# Patient Record
Sex: Female | Born: 1960 | Race: White | Hispanic: No | Marital: Single | Smoking: Never smoker
Health system: Southern US, Community
[De-identification: ages and names within clinical notes are randomized; demographics above are authoritative.]

## PROBLEM LIST (undated history)

## (undated) DIAGNOSIS — E785 Hyperlipidemia, unspecified: Secondary | ICD-10-CM

## (undated) DIAGNOSIS — O24419 Gestational diabetes mellitus in pregnancy, unspecified control: Secondary | ICD-10-CM

## (undated) HISTORY — DX: Gestational diabetes mellitus in pregnancy, unspecified control: O24.419

## (undated) HISTORY — DX: Hyperlipidemia, unspecified: E78.5

---

## 1983-12-20 HISTORY — PX: FRACTURE SURGERY: SHX138

## 2008-11-27 ENCOUNTER — Encounter (INDEPENDENT_AMBULATORY_CARE_PROVIDER_SITE_OTHER): Payer: Self-pay | Admitting: Obstetrics & Gynecology

## 2008-11-27 ENCOUNTER — Other Ambulatory Visit: Admission: RE | Admit: 2008-11-27 | Discharge: 2008-11-27 | Payer: Self-pay | Admitting: Obstetrics and Gynecology

## 2008-11-27 ENCOUNTER — Ambulatory Visit: Payer: Self-pay | Admitting: Obstetrics & Gynecology

## 2009-01-26 ENCOUNTER — Ambulatory Visit (HOSPITAL_COMMUNITY): Admission: RE | Admit: 2009-01-26 | Discharge: 2009-01-26 | Payer: Self-pay | Admitting: Obstetrics & Gynecology

## 2011-09-23 LAB — POCT URINALYSIS DIP (DEVICE)
Glucose, UA: NEGATIVE mg/dL
Hgb urine dipstick: NEGATIVE
Specific Gravity, Urine: 1.015 (ref 1.005–1.030)
Urobilinogen, UA: 0.2 mg/dL (ref 0.0–1.0)
pH: 6 (ref 5.0–8.0)

## 2012-10-16 ENCOUNTER — Ambulatory Visit: Payer: Self-pay | Admitting: Emergency Medicine

## 2012-10-16 VITALS — BP 112/75 | HR 88 | Temp 98.4°F | Resp 18 | Ht 65.0 in | Wt 165.0 lb

## 2012-10-16 DIAGNOSIS — N390 Urinary tract infection, site not specified: Secondary | ICD-10-CM

## 2012-10-16 DIAGNOSIS — R32 Unspecified urinary incontinence: Secondary | ICD-10-CM

## 2012-10-16 DIAGNOSIS — N3000 Acute cystitis without hematuria: Secondary | ICD-10-CM

## 2012-10-16 LAB — POCT UA - MICROSCOPIC ONLY
Crystals, Ur, HPF, POC: NEGATIVE
Mucus, UA: NEGATIVE
Yeast, UA: NEGATIVE

## 2012-10-16 LAB — POCT URINALYSIS DIPSTICK
Bilirubin, UA: NEGATIVE
Blood, UA: NEGATIVE
Ketones, UA: NEGATIVE
Nitrite, UA: NEGATIVE
pH, UA: 6

## 2012-10-16 MED ORDER — SULFAMETHOXAZOLE-TRIMETHOPRIM 800-160 MG PO TABS: 1.0000 | ORAL_TABLET | Freq: Two times a day (BID) | ORAL | Status: DC

## 2012-10-16 MED ORDER — PHENAZOPYRIDINE HCL 200 MG PO TABS: 200.0000 mg | ORAL_TABLET | Freq: Three times a day (TID) | ORAL | Status: AC | PRN

## 2012-10-16 NOTE — Progress Notes (Signed)
[facility] [address] [place] [address] [phone]  Date:  10/16/2012   Name:  Leah Blankenship   DOB:  03/22/1961   MRN:  [id number]  PCP:  No primary provider on file.    Chief Complaint: Urinary Tract Infection   History of Present Illness:  Leah Blankenship is a 51 y.o. very pleasant female patient who presents with the following:  Dysuria urgency and frequent urination and nocturia for past 2-3 months.  Experiencing new symptom of gravity incontinence over past three months as well. No fever or chills, back pain, nausea or vomiting.  No stool change. No other complaint.  Not on hormone replacement.    There is no problem list on file for this patient.   Past Medical History  Diagnosis Date  . Allergy     Past Surgical History  Procedure Date  . Fracture surgery     History  Substance Use Topics  . Smoking status: Never Smoker   . Smokeless tobacco: Not on file  . Alcohol Use: No    Family History  Problem Relation Age of Onset  . Heart disease Father   . Cancer Sister   . Cancer Brother     No Known Allergies  Medication list has been reviewed and updated.  No current outpatient prescriptions on file prior to visit.    Review of Systems:  As per HPI, otherwise negative.    Physical Examination: Filed Vitals:   10/16/12 1420  BP: 112/75  Pulse: 88  Temp: 98.4 F (36.9 C)  Resp: 18   Filed Vitals:   10/16/12 1420  Height: 5\' 5"  (1.651 m)  Weight: 165 lb (74.844 kg)   Body mass index is 27.46 kg/(m^2). Ideal Body Weight: Weight in (lb) to have BMI = 25: 149.9    GEN: WDWN, NAD, Non-toxic, Alert & Oriented x 3 HEENT: Atraumatic, Normocephalic.  Ears and Nose: No external deformity. EXTR: No clubbing/cyanosis/edema NEURO: Normal gait.  PSYCH: Normally interactive. Conversant. Not depressed or anxious appearing.  Calm demeanor.    Assessment and  Plan: Nocturia Incontinence Cystitis Larene Pickett, MD

## 2014-02-16 ENCOUNTER — Encounter (HOSPITAL_COMMUNITY): Payer: Self-pay | Admitting: Emergency Medicine

## 2014-02-16 ENCOUNTER — Emergency Department (HOSPITAL_COMMUNITY)
Admission: EM | Admit: 2014-02-16 | Discharge: 2014-02-16 | Disposition: A | Discharge status: Home / Self Care | Payer: BC Managed Care – PPO | Source: Home / Self Care

## 2014-02-16 DIAGNOSIS — B09 Unspecified viral infection characterized by skin and mucous membrane lesions: Secondary | ICD-10-CM

## 2014-02-16 DIAGNOSIS — K1370 Unspecified lesions of oral mucosa: Secondary | ICD-10-CM

## 2014-02-16 DIAGNOSIS — J029 Acute pharyngitis, unspecified: Secondary | ICD-10-CM

## 2014-02-16 DIAGNOSIS — B9789 Other viral agents as the cause of diseases classified elsewhere: Secondary | ICD-10-CM

## 2014-02-16 LAB — POCT RAPID STREP A: STREPTOCOCCUS, GROUP A SCREEN (DIRECT): NEGATIVE

## 2014-02-16 NOTE — Discharge Instructions (Signed)
Pharyngitis Pharyngitis is redness, pain, and swelling (inflammation) of your pharynx.  CAUSES  Pharyngitis is usually caused by infection. Most of the time, these infections are from viruses (viral) and are part of a cold. However, sometimes pharyngitis is caused by bacteria (bacterial). Pharyngitis can also be caused by allergies. Viral pharyngitis may be spread from person to person by coughing, sneezing, and personal items or utensils (cups, forks, spoons, toothbrushes). Bacterial pharyngitis may be spread from person to person by more intimate contact, such as kissing.  SIGNS AND SYMPTOMS  Symptoms of pharyngitis include:   Sore throat.   Tiredness (fatigue).   Low-grade fever.   Headache.  Joint pain and muscle aches.  Skin rashes.  Swollen lymph nodes.  Plaque-like film on throat or tonsils (often seen with bacterial pharyngitis). DIAGNOSIS  Your health care provider will ask you questions about your illness and your symptoms. Your medical history, along with a physical exam, is often all that is needed to diagnose pharyngitis. Sometimes, a rapid strep test is done. Other lab tests may also be done, depending on the suspected cause.  TREATMENT  Viral pharyngitis will usually get better in 3 4 days without the use of medicine. Bacterial pharyngitis is treated with medicines that kill germs (antibiotics).  HOME CARE INSTRUCTIONS   Drink enough water and fluids to keep your urine clear or pale yellow.   Only take over-the-counter or prescription medicines as directed by your health care provider:   If you are prescribed antibiotics, make sure you finish them even if you start to feel better.   Do not take aspirin.   Get lots of rest.   Gargle with 8 oz of salt water ( tsp of salt per 1 qt of water) as often as every 1 2 hours to soothe your throat.   Throat lozenges (if you are not at risk for choking) or sprays may be used to soothe your throat. SEEK MEDICAL  CARE IF:   You have large, tender lumps in your neck.  You have a rash.  You cough up green, yellow-brown, or bloody spit. SEEK IMMEDIATE MEDICAL CARE IF:   Your neck becomes stiff.  You drool or are unable to swallow liquids.  You vomit or are unable to keep medicines or liquids down.  You have severe pain that does not go away with the use of recommended medicines.  You have trouble breathing (not caused by a stuffy nose). MAKE SURE YOU:   Understand these instructions.  Will watch your condition.  Will get help right away if you are not doing well or get worse. Document Released: 12/05/2005 Document Revised: 09/25/2013 Document Reviewed: 08/12/2013 [facility] Patient Information 2014 [place].  Viral Infections A viral infection can be caused by different types of viruses.Most viral infections are not serious and resolve on their own. However, some infections may cause severe symptoms and may lead to further complications. SYMPTOMS Viruses can frequently cause:  Minor sore throat.  Aches and pains.  Headaches.  Runny nose.  Different types of rashes.  Watery eyes.  Tiredness.  Cough.  Loss of appetite.  Gastrointestinal infections, resulting in nausea, vomiting, and diarrhea. These symptoms do not respond to antibiotics because the infection is not caused by bacteria. However, you might catch a bacterial infection following the viral infection. This is sometimes called a "superinfection." Symptoms of such a bacterial infection may include:  Worsening sore throat with pus and difficulty swallowing.  Swollen neck glands.  Chills and a  high or persistent fever.  Severe headache.  Tenderness over the sinuses.  Persistent overall ill feeling (malaise), muscle aches, and tiredness (fatigue).  Persistent cough.  Yellow, green, or brown mucus production with coughing. HOME CARE INSTRUCTIONS   Only take over-the-counter or prescription medicines  for pain, discomfort, diarrhea, or fever as directed by your caregiver.  Drink enough water and fluids to keep your urine clear or pale yellow. Sports drinks can provide valuable electrolytes, sugars, and hydration.  Get plenty of rest and maintain proper nutrition. Soups and broths with crackers or rice are fine. SEEK IMMEDIATE MEDICAL CARE IF:   You have severe headaches, shortness of breath, chest pain, neck pain, or an unusual rash.  You have uncontrolled vomiting, diarrhea, or you are unable to keep down fluids.  You or your child has an oral temperature above 102 F (38.9 C), not controlled by medicine.  Your baby is older than 3 months with a rectal temperature of 102 F (38.9 C) or higher.  Your baby is 303 months old or younger with a rectal temperature of 100.4 F (38 C) or higher. MAKE SURE YOU:   Understand these instructions.  Will watch your condition.  Will get help right away if you are not doing well or get worse. Document Released: 09/14/2005 Document Revised: 02/27/2012 Document Reviewed: 04/11/2011 [facility] Patient Information 2014 [place].

## 2014-02-16 NOTE — ED Provider Notes (Signed)
CSN: [id number]     Arrival date & time 02/16/14  1455 History   First MD Initiated Contact with Patient 02/16/14 1615     Chief Complaint  Patient presents with  . Sore Throat   (Consider location/radiation/quality/duration/timing/severity/associated sxs/prior Treatment) HPI Comments: 53 year old female presents with a sore throat for the past 4-5 days. Preceding a sore throat she had an episode of sinusitis followed by bronchitis. Once some of the symptoms the sore throat developed. He states there are red sore spots on the roof of her mouth. Denies fever or other symptoms.  Patient is a 53 y.o. female presenting with pharyngitis.  Sore Throat    Past Medical History  Diagnosis Date  . Allergy    Past Surgical History  Procedure Laterality Date  . Fracture surgery     Family History  Problem Relation Age of Onset  . Heart disease Father   . Cancer Sister   . Cancer Brother    History  Substance Use Topics  . Smoking status: Never Smoker   . Smokeless tobacco: Not on file  . Alcohol Use: No   OB History   Grav Para Term Preterm Abortions TAB SAB Ect Mult Living                 Review of Systems  Constitutional: Negative for fever, activity change, appetite change and fatigue.  HENT: Positive for mouth sores and sore throat. Negative for congestion, ear pain, postnasal drip, rhinorrhea and trouble swallowing.   Respiratory: Positive for cough.   Cardiovascular: Negative.   Gastrointestinal: Negative.   Genitourinary: Negative.   Neurological: Negative.     Allergies  Review of patient's allergies indicates no known allergies.  Home Medications   Current Outpatient Rx  Name  Route  Sig  Dispense  Refill  . sulfamethoxazole-trimethoprim (BACTRIM DS,SEPTRA DS) 800-160 MG per tablet   Oral   Take 1 tablet by mouth 2 (two) times daily.   20 tablet   0    BP 119/84  Pulse 81  Temp(Src) 98.9 F (37.2 C) (Oral)  Resp 18  SpO2 98% Physical Exam  Nursing  note and vitals reviewed. Constitutional: She is oriented to person, place, and time. She appears well-developed and well-nourished. No distress.  HENT:  Right Ear: External ear normal.  Left Ear: External ear normal.  Mouth/Throat: No oropharyngeal exudate.  Minor oropharyngeal erythema without exudates. There are annular red lesions to the hard and soft palate. They measure approximately 3-595mm. They are not petechiae. They are tender and painful when eating and drinking.  Eyes: Conjunctivae and EOM are normal.  Neck: Normal range of motion. Neck supple.  Cardiovascular: Normal rate and normal heart sounds.   Pulmonary/Chest: Effort normal and breath sounds normal. No respiratory distress. She has no wheezes.  Lymphadenopathy:    She has no cervical adenopathy.  Neurological: She is alert and oriented to person, place, and time.  Skin: Skin is warm and dry. No rash noted.  Psychiatric: She has a normal mood and affect.    ED Course  Procedures (including critical care time) Labs Review Labs Reviewed  POCT RAPID STREP A ([facility] URG CARE ONLY)   Imaging Review No results found.   MDM   1. Viral pharyngitis   2. Viral enanthem of mouth     Reassurance. Course spray to the mouth and P. They also use Cepacol lozenges. Drink plenty of fluids perfectly cool fluids. Avoid spicy and acidic foods or drink.  Hayden Rasmussen, NP 02/16/14 [phone]

## 2014-02-16 NOTE — ED Notes (Addendum)
Pt c/o sore throat x 3 wks. Noticed yesterday that she has some red spots on the roof of her mouth. Pt reports She has had cold sx x 1 month which has subsided. Denies fever. Pt has tried gargling with salt water with no relief. Pt is alert and oriented and in no acute distress.

## 2014-02-17 NOTE — ED Provider Notes (Signed)
Medical screening examination/treatment/procedure(s) were performed by a resident physician or non-physician practitioner and as the supervising physician I was immediately available for consultation/collaboration.  Fareed Fung, MD    Hatem Cull S Tyashia Morrisette, MD 02/17/14 0748 

## 2014-02-18 LAB — CULTURE, GROUP A STREP

## 2014-05-06 ENCOUNTER — Encounter (HOSPITAL_COMMUNITY): Payer: Self-pay | Admitting: Emergency Medicine

## 2014-05-06 ENCOUNTER — Emergency Department (HOSPITAL_COMMUNITY)
Admission: EM | Admit: 2014-05-06 | Discharge: 2014-05-06 | Disposition: A | Discharge status: Home / Self Care | Payer: BC Managed Care – PPO | Source: Home / Self Care | Attending: Emergency Medicine | Admitting: Emergency Medicine

## 2014-05-06 DIAGNOSIS — G609 Hereditary and idiopathic neuropathy, unspecified: Secondary | ICD-10-CM

## 2014-05-06 DIAGNOSIS — M722 Plantar fascial fibromatosis: Secondary | ICD-10-CM

## 2014-05-06 DIAGNOSIS — G629 Polyneuropathy, unspecified: Secondary | ICD-10-CM

## 2014-05-06 MED ORDER — MELOXICAM 15 MG PO TABS: 15.0000 mg | ORAL_TABLET | Freq: Every day | ORAL | Status: DC

## 2014-05-06 NOTE — ED Provider Notes (Signed)
  Chief Complaint   Chief Complaint  Patient presents with  . Foot Pain    History of Present Illness   Leah Blankenship is a 53 year old female with a history of plantar fasciitis who has had a 3 day history of numbness and tingling of the lateral aspect of the right foot and pain over the heel and the medial aspect of the right foot and the sometimes wakes her up at night and is severe when she first gets up in the morning. She denies any trauma or injury. There is no swelling. No weakness. She denies any pain in the back, buttock, thigh, or calf.  Review of Systems   Other than as noted above, the patient denies any of the following symptoms: Systemic:  No fevers or chills. Musculoskeletal:  No joint pain or arthritis.  Neurological:  No muscular weakness, paresthesias.   PMFSH   Past medical history, family history, social history, meds, and allergies were reviewed.     Physical  Examination     Vital signs:  BP 105/64  Pulse 66  Temp(Src) 97.5 F (36.4 C) (Oral)  Resp 18  SpO2 100% Gen:  Alert and oriented times 3.  In no distress. Musculoskeletal:  Exam of the foot reveals there is pain to palpation of the insertion of the plantar fascia and medially over the heel. There is no pain to palpation laterally over the heel, over the fifth metatarsal, over the ankle, or the calf.  Otherwise, all joints had a full a ROM with no swelling, bruising or deformity.  No edema, pulses full. Extremities were warm and pink.  Capillary refill was brisk.  Skin:  Clear, warm and dry.  No rash. Neuro:  Alert and oriented times 3.  Muscle strength was normal.  Sensation was intact to light touch, except for diminished sensation over the little toe.   Assessment   The primary encounter diagnosis was Plantar fasciitis. A diagnosis of Peripheral neuropathy was also pertinent to this visit.  The pain in the foot would be explained by plantar fasciitis, this does not explain the numbness of the  lateral aspect of the foot. It does not appear to be sciatica. I suspect I nerve compression syndrome. I told her I thought she had 2 separate problems. I recommended referral to a podiatrist for the plantar fasciitis, and a neurologist for the neuropathy. I also offered a corticosteroid injection but she declined.  Plan    1.  Meds:  The following meds were prescribed:   Discharge Medication List as of 05/06/2014  3:44 PM    START taking these medications   Details  meloxicam (MOBIC) 15 MG tablet Take 1 tablet (15 mg total) by mouth daily., Starting 05/06/2014, Until Discontinued, Normal        2.  Patient Education/Counseling:  The patient was given appropriate handouts, self care instructions, and instructed in symptomatic relief including rest and activity, elevation, application of ice and compression.  She will do stretching exercises, by a shoe insert, and get a night splint.  3.  Follow up:  The patient was told to follow up here if no better in 3 to 4 days, or sooner if becoming worse in any way, and given some red flag symptoms such as worsening pain or neurological symptoms which would prompt immediate return.  Follow up here as necessary.       Reuben Likesavid C Stanton Kissoon, MD 05/06/14 [phone]

## 2014-05-06 NOTE — ED Notes (Signed)
Pt c/o bilateral foot pain onset 3-4 days Right foot is worse; sx include numbness, tingly and sharp pains Hx of plantar fascitis  Alert w/no signs of acute distress; ambulated well to exam room w/NAD

## 2014-05-06 NOTE — Discharge Instructions (Signed)
Plantar fasciitis is a tendonitis (inflammed tendon) of the the plantar fascia, the tendon on the bottom of the foot that supports the arch.  Often the pain is localized to the heel and can be worse first thing in the morning after getting up or after sitting for a long period of time and tends to improve as the day goes by, only to worsen later on in the afternoon or evening after you have been on your feet for a long time.  Following the program outlined below cures most cases.  If conservative measures like these don't work, corticosteroid injection, or referral to a podiatrist are other options. ° °· Wear well fitting, lace up shoes with good arch support.  Tennis or running shoes are the best.  Do not wear heels, flip-flops, scuffs, or any kind of shoe without adequate support.   °· Use a heel lift.  These can be purchased at a shoe store or pharmacy.  They come in various price ranges.  Some people find that an orthotic insert with arch support works better. °· Wear a night splint.  These can be purchased on line at www.[employer].com.  The product to look for is the "Freedom Dorsal Night Splint."  [employer] also sells other products for plantar fasciitis including heel lifts and orthotic inserts. °· Use of over the counter pain meds can be of help.  Tylenol (or acetaminophen) is the safest to use.  It often helps to take this regularly.  You can take up to 2 325 mg tablets 5 times daily, but it best to start out much lower that that, perhaps 2 325 mg tablets twice daily, then increase from there. People who are on the blood thinner warfarin have to be careful about taking high doses of Tylenol.  For people who are able to tolerate them, ibuprofen and naproxyn can also help with the pain.  You should discuss these agents with your physician before taking them.  People with chronic kidney disease, hypertension, peptic ulcer disease, and reflux can suffer adverse side effects. They should not be taken with warfarin.  The maximum dosage of ibuprofen is 800 mg 3 times daily with meals.  The maximum dosage of naprosyn is 2 and 1/2 tablets twice daily with food, but again, start out low and gradually increase the dose until adequate pain relief is achieved. Ibuprofen and naprosyn should always be taken with food. °· Ice massage is helpful.  The best way to apply this is to freeze a bottle of water, place in on a towel and roll your foot over the bottle to produce an ice massage effect. This can be done as often as every 2 to 3 hours, depending on symptoms.  At the very least, try to do after you have been on your feet for a long period of time and after doing the exercises outlined below. °· Do the exercises outlined below twice daily: ° ° ° ° ° ° ° ° ° ° ° ° ° ° ° ° ° ° °

## 2014-10-01 ENCOUNTER — Emergency Department (INDEPENDENT_AMBULATORY_CARE_PROVIDER_SITE_OTHER)
Admission: EM | Admit: 2014-10-01 | Discharge: 2014-10-01 | Disposition: A | Discharge status: Home / Self Care | Payer: BC Managed Care – PPO | Source: Home / Self Care | Attending: Emergency Medicine | Admitting: Emergency Medicine

## 2014-10-01 ENCOUNTER — Encounter (HOSPITAL_COMMUNITY): Payer: Self-pay | Admitting: Emergency Medicine

## 2014-10-01 DIAGNOSIS — R197 Diarrhea, unspecified: Secondary | ICD-10-CM

## 2014-10-01 MED ORDER — ONDANSETRON 4 MG PO TBDP: 4.0000 mg | ORAL_TABLET | Freq: Three times a day (TID) | ORAL | Status: DC | PRN

## 2014-10-01 MED ORDER — ALIGN 4 MG PO CAPS: 1.0000 | ORAL_CAPSULE | Freq: Every day | ORAL | Status: DC

## 2014-10-01 NOTE — ED Notes (Signed)
Patient provided w cups, gloves, bedpan to attempt to collect stool sample

## 2014-10-01 NOTE — Discharge Instructions (Signed)
Clostridium Difficile Toxin °This is a test which may be done when a patient has diarrhea that lasts for several days, or has abdominal pain, fever, and nausea after antibiotic therapy.  °This test looks for the presence of Clostridium difficile (C.diff.) toxin in a stool sample. C.diff. is a germ (bacterium) that is one of the groups of bacteria that are usually in the colon, called "normal flora." If something upsets the growth of the other normal flora, C.diff. may overgrow and disrupt the balance of bacteria in the colon. C. diff. may produce two toxins, A and B. The combination of overgrowth and toxins may cause prolonged diarrhea. The toxins may damage the lining of the colon and lead to colitis.  °While some cases of C. diff. diarrhea and colitis do not require treatment, others require specific oral antibiotic therapy. Most patients improve as the normal flora re-establishes itself, but about some may have one or more relapses, with symptoms and detectible toxin levels coming back. °PREPARATION FOR TEST °There is no special preparation for the test. A fresh stool sample is collected in a sterile container. The sample should not be mixed with urine or water. The stool should be taken to the lab within an hour. It may be refrigerated or frozen and taken to the lab as soon as possible. The container should be labeled with your name and the date and time of the stool collection.  °NORMAL FINDINGS °Negative Tissue Culture (no toxin identified) °Ranges for normal findings may vary among different laboratories and hospitals. You should always check with your doctor after having lab work or other tests done to discuss the meaning of your test results and whether your values are considered within normal limits. °MEANING OF TEST  °Your caregiver will go over the test results with you and discuss the importance and meaning of your results, as well as treatment options and the need for additional tests if  necessary. °OBTAINING THE TEST RESULTS °It is your responsibility to obtain your test results. Ask the lab or department performing the test when and how you will get your results. °Document Released: 12/28/2004 Document Revised: 02/27/2012 Document Reviewed: 11/13/2008 °ExitCare® Patient Information ©2015 [employer]. This information is not intended to replace advice given to you by your health care provider. Make sure you discuss any questions you have with your health care provider. ° °Diarrhea °Diarrhea is frequent loose and watery bowel movements. It can cause you to feel weak and dehydrated. Dehydration can cause you to become tired and thirsty, have a dry mouth, and have decreased urination that often is dark yellow. Diarrhea is a sign of another problem, most often an infection that will not last long. In most cases, diarrhea typically lasts 2-3 days. However, it can last longer if it is a sign of something more serious. It is important to treat your diarrhea as directed by your caregiver to lessen or prevent future episodes of diarrhea. °CAUSES  °Some common causes include: °· Gastrointestinal infections caused by viruses, bacteria, or parasites. °· Food poisoning or food allergies. °· Certain medicines, such as antibiotics, chemotherapy, and laxatives. °· Artificial sweeteners and fructose. °· Digestive disorders. °HOME CARE INSTRUCTIONS °· Ensure adequate fluid intake (hydration): Have 1 cup (8 oz) of fluid for each diarrhea episode. Avoid fluids that contain simple sugars or sports drinks, fruit juices, whole milk products, and sodas. Your urine should be clear or pale yellow if you are drinking enough fluids. Hydrate with an oral rehydration solution that you   can purchase at pharmacies, retail stores, and online. You can prepare an oral rehydration solution at home by mixing the following ingredients together:   - tsp table salt.   tsp baking soda.   tsp salt substitute containing potassium  chloride.  1  tablespoons sugar.  1 L (34 oz) of water.  Certain foods and beverages may increase the speed at which food moves through the gastrointestinal (GI) tract. These foods and beverages should be avoided and include:  Caffeinated and alcoholic beverages.  High-fiber foods, such as raw fruits and vegetables, nuts, seeds, and whole grain breads and cereals.  Foods and beverages sweetened with sugar alcohols, such as xylitol, sorbitol, and mannitol.  Some foods may be well tolerated and may help thicken stool including:  Starchy foods, such as rice, toast, pasta, low-sugar cereal, oatmeal, grits, baked potatoes, crackers, and bagels.  Bananas.  Applesauce.  Add probiotic-rich foods to help increase healthy bacteria in the GI tract, such as yogurt and fermented milk products.  Wash your hands well after each diarrhea episode.  Only take over-the-counter or prescription medicines as directed by your caregiver.  Take a warm bath to relieve any burning or pain from frequent diarrhea episodes. SEEK IMMEDIATE MEDICAL CARE IF:   You are unable to keep fluids down.  You have persistent vomiting.  You have blood in your stool, or your stools are black and tarry.  You do not urinate in 6-8 hours, or there is only a small amount of very dark urine.  You have abdominal pain that increases or localizes.  You have weakness, dizziness, confusion, or light-headedness.  You have a severe headache.  Your diarrhea gets worse or does not get better.  You have a fever or persistent symptoms for more than 2-3 days.  You have a fever and your symptoms suddenly get worse. MAKE SURE YOU:   Understand these instructions.  Will watch your condition.  Will get help right away if you are not doing well or get worse. Document Released: 11/25/2002 Document Revised: 04/21/2014 Document Reviewed: 08/12/2012 [facility] Patient Information 2015 [place], [place]. This information is not  intended to replace advice given to you by your health care provider. Make sure you discuss any questions you have with your health care provider.  Diet and Irritable Bowel Syndrome  No cure has been found for irritable bowel syndrome (IBS). Many options are available to treat the symptoms. Your caregiver will give you the best treatments available for your symptoms. He or she will also encourage you to manage stress and to make changes to your diet. You need to work with your caregiver and Registered Dietician to find the best combination of medicine, diet, counseling, and support to control your symptoms. The following are some diet suggestions. FOODS THAT MAKE IBS WORSE  Fatty foods, such as [place] fries.  Milk products, such as cheese or ice cream.  Chocolate.  Alcohol.  Caffeine (found in coffee and some sodas).  Carbonated drinks, such as soda. If certain foods cause symptoms, you should eat less of them or stop eating them. FOOD JOURNAL   Keep a journal of the foods that seem to cause distress. Write down:  What you are eating during the day and when.  What problems you are having after eating.  When the symptoms occur in relation to your meals.  What foods always make you feel badly.  Take your notes with you to your caregiver to see if you should stop eating certain foods.  FOODS THAT MAKE IBS BETTER Fiber reduces IBS symptoms, especially constipation, because it makes stools soft, bulky, and easier to pass. Fiber is found in bran, bread, cereal, beans, fruit, and vegetables. Examples of foods with fiber include:  Apples.  Peaches.  Pears.  Berries.  Figs.  Broccoli, raw.  Cabbage.  Carrots.  Raw peas.  Kidney beans.  Lima beans.  Whole-grain bread.  Whole-grain cereal. Add foods with fiber to your diet a little at a time. This will let your body get used to them. Too much fiber at once might cause gas and swelling of your abdomen. This can trigger  symptoms in a person with IBS. Caregivers usually recommend a diet with enough fiber to produce soft, painless bowel movements. High fiber diets may cause gas and bloating. However, these symptoms often go away within a few weeks, as your body adjusts. In many cases, dietary fiber may lessen IBS symptoms, particularly constipation. However, it may not help pain or diarrhea. High fiber diets keep the colon mildly enlarged (distended) with the added fiber. This may help prevent spasms in the colon. Some forms of fiber also keep water in the stool, thereby preventing hard stools that are difficult to pass.  Besides telling you to eat more foods with fiber, your caregiver may also tell you to get more fiber by taking a fiber pill or drinking water mixed with a special high fiber powder. An example of this is a natural fiber laxative containing psyllium seed.  TIPS  Large meals can cause cramping and diarrhea in people with IBS. If this happens to you, try eating 4 or 5 small meals a day, or try eating less at each of your usual 3 meals. It may also help if your meals are low in fat and high in carbohydrates. Examples of carbohydrates are pasta, rice, whole-grain breads and cereals, fruits, and vegetables.  If dairy products cause your symptoms to flare up, you can try eating less of those foods. You might be able to handle yogurt better than other dairy products, because it contains bacteria that helps with digestion. Dairy products are an important source of calcium and other nutrients. If you need to avoid dairy products, be sure to talk with a Registered Dietitian about getting these nutrients through other food sources.  Drink enough water and fluids to keep your urine clear or pale yellow. This is important, especially if you have diarrhea. FOR MORE INFORMATION  International Foundation for Functional Gastrointestinal Disorders: www.iffgd.org  National Digestive Diseases Information Clearinghouse:  digestive.StageSync.si Document Released: 02/25/2004 Document Revised: 02/27/2012 Document Reviewed: 03/07/2014 [facility] Patient Information 2015 [place], [place]. This information is not intended to replace advice given to you by your health care provider. Make sure you discuss any questions you have with your health care provider.  Chronic Diarrhea Diarrhea is frequent loose and watery bowel movements. It can cause you to feel weak and dehydrated. Dehydration can cause you to become tired and thirsty and to have a dry mouth, decreased urination, and dark yellow urine. Diarrhea is a sign of another problem, most often an infection that will not last long. In most cases, diarrhea lasts 2-3 days. Diarrhea that lasts longer than 4 weeks is called long-lasting (chronic) diarrhea. It is important to treat your diarrhea as directed by your health care provider to lessen or prevent future episodes of diarrhea.  CAUSES  There are many causes of chronic diarrhea. The following are some possible causes:   Gastrointestinal infections caused by  viruses, bacteria, or parasites.   Food poisoning or food allergies.   Certain medicines, such as antibiotics, chemotherapy, and laxatives.   Artificial sweeteners and fructose.   Digestive disorders, such as celiac disease and inflammatory bowel diseases.   Irritable bowel syndrome.  Some disorders of the pancreas.  Disorders of the thyroid.  Reduced blood flow to the intestines.  Cancer. Sometimes the cause of chronic diarrhea is unknown. RISK FACTORS  Having a severely weakened immune system, such as from HIV or AIDS.   Taking certain types of cancer-fighting drugs (such as with chemotherapy) or other medicines.   Having had a recent organ transplant.   Having a portion of the stomach or small bowel removed.   Traveling to countries where food and water supplies are often contaminated.  SYMPTOMS  In addition to frequent, loose  stools, diarrhea may cause:   Cramping.   Abdominal pain.   Nausea.   Fever.  Fatigue.  Urgent need to use the bathroom.  Loss of bowel control. DIAGNOSIS  Your health care provider must take a careful history and perform a physical exam. Tests given are based on your symptoms and history. Tests may include:   Blood or stool tests. Three or more stool samples may be examined. Stool cultures may be used to test for bacteria or parasites.   X-rays.   A procedure in which a thin tube is inserted into the mouth or rectum (endoscopy). This allows the health care provider to look inside the intestine.  TREATMENT   Treatment is aimed at correcting the cause of the diarrhea when possible.  Diarrhea caused by an infection can often be treated with antibiotic medicines.  Diarrhea not caused by an infection may require you to take long-term medicine or have surgery. Specific treatment should be discussed with your health care provider.  If the cause cannot be determined, treatment aims to relieve symptoms and prevent dehydration. Serious health problems can occur if you do not maintain proper fluid levels. Treatment may include:  Taking an oral rehydration solution (ORS).  Not drinking beverages that contain caffeine (such as tea, coffee, and soft drinks).  Not drinking alcohol.  Maintaining well-balanced nutrition to help you recover faster. HOME CARE INSTRUCTIONS   Drink enough fluids to keep urine clear or pale yellow. Drink 1 cup (8 oz) of fluid for each diarrhea episode. Avoid fluids that contain simple sugars, fruit juices, whole milk products, and sodas. Hydrate with an ORS. You may purchase the ORS or prepare it at home by mixing the following ingredients together:   - tsp (1.7-3  mL) table salt.   tsp (3  mL) baking soda.   tsp (1.7 mL) salt substitute containing potassium chloride.  1 tbsp (20 mL) sugar.  4.2 c (1 L) of water.   Certain foods and  beverages may increase the speed at which food moves through the gastrointestinal (GI) tract. These foods and beverages should be avoided. They include:  Caffeinated and alcoholic beverages.  High-fiber foods, such as raw fruits and vegetables, nuts, seeds, and whole grain breads and cereals.  Foods and beverages sweetened with sugar alcohols, such as xylitol, sorbitol, and mannitol.   Some foods may be well tolerated and may help thicken stool. These include:  Starchy foods, such as rice, toast, pasta, low-sugar cereal, oatmeal, grits, baked potatoes, crackers, and bagels.  Bananas.  Applesauce.  Add probiotic-rich foods to help increase healthy bacteria in the GI tract. These include yogurt and fermented milk products.  Wash your hands well after each diarrhea episode.  Only take over-the-counter or prescription medicines as directed by your health care provider.  Take a warm bath to relieve any burning or pain from frequent diarrhea episodes. SEEK MEDICAL CARE IF:   You are not urinating as often.  Your urine is a dark color.  You become very tired or dizzy.  You have severe pain in the abdomen or rectum.  Your have blood or pus in your stools.  Your stools look black and tarry. SEEK IMMEDIATE MEDICAL CARE IF:   You are unable to keep fluids down.  You have persistent vomiting.  You have blood in your stool.  Your stools are black and tarry.  You do not urinate in 6-8 hours, or there is only a small amount of very dark urine.  You have abdominal pain that increases or localizes.  You have weakness, dizziness, confusion, or lightheadedness.  You have a severe headache.  Your diarrhea gets worse or does not get better.  You have a fever or persistent symptoms for more than 2-3 days.  You have a fever and your symptoms suddenly get worse. MAKE SURE YOU:   Understand these instructions.  Will watch your condition.  Will get help right away if you are  not doing well or get worse. Document Released: 02/25/2004 Document Revised: 12/10/2013 Document Reviewed: 05/30/2013 [facility] Patient Information 2015 [place]ExitCare, [place]LLC. This information is not intended to replace advice given to you by your health care provider. Make sure you discuss any questions you have with your health care provider.

## 2014-10-01 NOTE — ED Provider Notes (Signed)
CSN: [id number]     Arrival date & time 10/01/14  1642 History   First MD Initiated Contact with Patient 10/01/14 1723     Chief Complaint  Patient presents with  . GI Problem   (Consider location/radiation/quality/duration/timing/severity/associated sxs/prior Treatment) HPI Comments: Patient reports she feels she began having a "stomach flu" 12 days ago that has consisted of nausea without vomiting, watery (non-bloody) diarrhea and intermittent abdominal cramping. Symptoms often occur when she tries to eat or drink something. Denies fever or previous abdominal surgery. Denies recent travel or camping. Denies recent antibiotic use, alcohol use, or ingestion of raw meats or seafood. Reports she has about 7 episodes of watery stools a day. States she has tried imodium and this made her very constipated.  Works as a Advertising copywriterhousekeeper PCP: Dr. Loreen FreudE. Dewey.  Patient is a 53 y.o. female presenting with GI illness. The history is provided by the patient.  GI Problem    Past Medical History  Diagnosis Date  . Allergy    Past Surgical History  Procedure Laterality Date  . Fracture surgery     Family History  Problem Relation Age of Onset  . Heart disease Father   . Cancer Sister   . Cancer Brother    History  Substance Use Topics  . Smoking status: Never Smoker   . Smokeless tobacco: Not on file  . Alcohol Use: No   OB History   Grav Para Term Preterm Abortions TAB SAB Ect Mult Living                 Review of Systems  All other systems reviewed and are negative.   Allergies  Review of patient's allergies indicates no known allergies.  Home Medications   Prior to Admission medications   Medication Sig Start Date End Date Taking? Authorizing Provider  meloxicam (MOBIC) 15 MG tablet Take 1 tablet (15 mg total) by mouth daily. 05/06/14   Reuben Likesavid C Keller, MD  ondansetron (ZOFRAN ODT) 4 MG disintegrating tablet Take 1 tablet (4 mg total) by mouth every 8 (eight) hours as needed for  nausea or vomiting. 10/01/14   Ria ClockJennifer Lee H Ambrea Hegler, PA  Probiotic Product (ALIGN) 4 MG CAPS Take 1 capsule (4 mg total) by mouth daily. 10/01/14   Mathis FareJennifer Lee H Jamaine Quintin, PA  sulfamethoxazole-trimethoprim (BACTRIM DS,SEPTRA DS) 800-160 MG per tablet Take 1 tablet by mouth 2 (two) times daily. 10/16/12   Carmelina DaneJeffery S Anderson, MD   BP 113/70  Pulse 86  Temp(Src) 98.5 F (36.9 C) (Oral)  Resp 16  SpO2 100% Physical Exam  Nursing note and vitals reviewed. Constitutional: She is oriented to person, place, and time. She appears well-developed and well-nourished. No distress.  HENT:  Head: Normocephalic and atraumatic.  Eyes: Conjunctivae are normal. No scleral icterus.  Neck: Normal range of motion. Neck supple.  Cardiovascular: Normal rate, regular rhythm and normal heart sounds.   Pulmonary/Chest: Effort normal and breath sounds normal. No respiratory distress. She has no wheezes.  Abdominal: Soft. Bowel sounds are normal. She exhibits no distension and no mass. There is no tenderness. There is no rebound and no guarding.  Musculoskeletal: Normal range of motion.  Neurological: She is alert and oriented to person, place, and time.  Skin: Skin is warm and dry. No rash noted. No erythema.  Psychiatric: She has a normal mood and affect. Her behavior is normal.    ED Course  Procedures (including critical care time) Labs Review Labs Reviewed  STOOL CULTURE  CLOSTRIDIUM DIFFICILE BY PCR    Imaging Review No results found.   MDM   1. Diarrhea    Will recommend patient collect a stool sample and return for C. Difficile testing and stool culture. Recommend she begin probiotic use at home. Will provide Rx for Zofran for at home use. Continue symptomatic care at home and ensure adequate hydration.     Ria ClockJennifer Lee H Magic Mohler, GeorgiaPA 10/01/14 [phone]

## 2014-10-01 NOTE — ED Notes (Signed)
C/o 12 day duration of abdominal pain and cramping. Symptoms worse after eating, relieved after has BM, or when her stomach is empty. No one else in home ill. imodium caused her to have worse pain due to constipation. States she is having multiple soft small stools ( was liquid stools at first) . Has been trying to keep up her fluids. Used one of her daughters ODT Zofran , which she states made her cramping better.

## 2014-10-01 NOTE — ED Provider Notes (Signed)
Medical screening examination/treatment/procedure(s) were performed by resident physician or non-physician practitioner and as supervising physician I was immediately available for consultation/collaboration.  Randal BubaErin Brenson Hartman, MD     Charm RingsErin J Mckenze Slone, MD 10/01/14 2022

## 2014-10-02 LAB — CLOSTRIDIUM DIFFICILE BY PCR: CDIFFPCR: NEGATIVE

## 2014-10-06 LAB — STOOL CULTURE: SPECIAL REQUESTS: NORMAL

## 2015-04-28 ENCOUNTER — Ambulatory Visit (INDEPENDENT_AMBULATORY_CARE_PROVIDER_SITE_OTHER): Payer: No Typology Code available for payment source | Admitting: Family Medicine

## 2015-04-28 ENCOUNTER — Encounter: Payer: Self-pay | Admitting: Family Medicine

## 2015-04-28 VITALS — BP 115/83 | HR 81 | Ht 66.5 in | Wt 170.4 lb

## 2015-04-28 DIAGNOSIS — Z124 Encounter for screening for malignant neoplasm of cervix: Secondary | ICD-10-CM | POA: Diagnosis not present

## 2015-04-28 DIAGNOSIS — Z01419 Encounter for gynecological examination (general) (routine) without abnormal findings: Secondary | ICD-10-CM

## 2015-04-28 DIAGNOSIS — Z1151 Encounter for screening for human papillomavirus (HPV): Secondary | ICD-10-CM | POA: Diagnosis not present

## 2015-04-28 NOTE — Progress Notes (Signed)
Patient is here for yearly exam, she has not had a pap smear in approximately 7 years and is also due for her mammogram.

## 2015-04-28 NOTE — Progress Notes (Signed)
  Subjective:     Leah JubaSherry H Rebel is a 54 y.o. female and is here for a comprehensive physical exam. The patient reports no problems.  History   Social History  . Marital Status: Married    Spouse Name: N/A  . Number of Children: N/A  . Years of Education: N/A   Occupational History  . Not on file.   Social History Main Topics  . Smoking status: Never Smoker   . Smokeless tobacco: Never Used  . Alcohol Use: 0.6 oz/week    1 Standard drinks or equivalent per week  . Drug Use: No  . Sexual Activity: No     Comment: separated   Other Topics Concern  . Not on file   Social History Narrative   Health Maintenance  Topic Date Due  . HIV Screening  10/02/1974  . PAP SMEAR  10/02/1977  . TETANUS/TDAP  10/02/1978  . COLONOSCOPY  10/02/2009  . MAMMOGRAM  01/26/2011  . INFLUENZA VACCINE  07/20/2015    The following portions of the patient's history were reviewed and updated as appropriate: allergies, current medications, past family history, past medical history, past social history, past surgical history and problem list.  Review of Systems A comprehensive review of systems was negative.   Objective:    BP 115/83 mmHg  Pulse 81  Ht 5' 6.5" (1.689 m)  Wt 170 lb 6.4 oz (77.293 kg)  BMI 27.09 kg/m2 General appearance: alert, cooperative, appears stated age and no distress Neck: no adenopathy, supple, symmetrical, trachea midline and thyroid not enlarged, symmetric, no tenderness/mass/nodules Lungs: clear to auscultation bilaterally Breasts: normal appearance, no masses or tenderness Heart: regular rate and rhythm, S1, S2 normal, no murmur, click, rub or gallop Abdomen: soft, non-tender; bowel sounds normal; no masses,  no organomegaly Pelvic: cervix normal in appearance, external genitalia normal, no adnexal masses or tenderness, no cervical motion tenderness, uterus normal size, shape, and consistency and atrophic vagina Extremities: extremities normal, atraumatic, no  cyanosis or edema Pulses: 2+ and symmetric Skin: Skin color, texture, turgor normal. No rashes or lesions Lymph nodes: Cervical, supraclavicular, and axillary nodes normal. Neurologic: Grossly normal    Assessment:    Healthy female exam. Menopause.     Plan:      Problem List Items Addressed This Visit    None    Visit Diagnoses    Encounter for routine gynecological examination    -  Primary    Relevant Orders    Cytology - PAP    TSH    CBC    Comprehensive metabolic panel    Lipid panel    MM DIGITAL SCREENING BILATERAL    Ambulatory referral to Gastroenterology    Hepatitis C antibody    Screening for malignant neoplasm of cervix        Relevant Orders    Cytology - PAP       See After Visit Summary for Counseling Recommendations

## 2015-04-28 NOTE — Patient Instructions (Signed)
Preventive Care for Adults A healthy lifestyle and preventive care can promote health and wellness. Preventive health guidelines for women include the following key practices.  A routine yearly physical is a good way to check with your health care provider about your health and preventive screening. It is a chance to share any concerns and updates on your health and to receive a thorough exam.  Visit your dentist for a routine exam and preventive care every 6 months. Brush your teeth twice a day and floss once a day. Good oral hygiene prevents tooth decay and gum disease.  The frequency of eye exams is based on your age, health, family medical history, use of contact lenses, and other factors. Follow your health care provider's recommendations for frequency of eye exams.  Eat a healthy diet. Foods like vegetables, fruits, whole grains, low-fat dairy products, and lean protein foods contain the nutrients you need without too many calories. Decrease your intake of foods high in solid fats, added sugars, and salt. Eat the right amount of calories for you.Get information about a proper diet from your health care provider, if necessary.  Regular physical exercise is one of the most important things you can do for your health. Most adults should get at least 150 minutes of moderate-intensity exercise (any activity that increases your heart rate and causes you to sweat) each week. In addition, most adults need muscle-strengthening exercises on 2 or more days a week.  Maintain a healthy weight. The body mass index (BMI) is a screening tool to identify possible weight problems. It provides an estimate of body fat based on height and weight. Your health care provider can find your BMI and can help you achieve or maintain a healthy weight.For adults 20 years and older:  A BMI below 18.5 is considered underweight.  A BMI of 18.5 to 24.9 is normal.  A BMI of 25 to 29.9 is considered overweight.  A BMI of  30 and above is considered obese.  Maintain normal blood lipids and cholesterol levels by exercising and minimizing your intake of saturated fat. Eat a balanced diet with plenty of fruit and vegetables. Blood tests for lipids and cholesterol should begin at age 76 and be repeated every 5 years. If your lipid or cholesterol levels are high, you are over 50, or you are at high risk for heart disease, you may need your cholesterol levels checked more frequently.Ongoing high lipid and cholesterol levels should be treated with medicines if diet and exercise are not working.  If you smoke, find out from your health care provider how to quit. If you do not use tobacco, do not start.  Lung cancer screening is recommended for adults aged 22-80 years who are at high risk for developing lung cancer because of a history of smoking. A yearly low-dose CT scan of the lungs is recommended for people who have at least a 30-pack-year history of smoking and are a current smoker or have quit within the past 15 years. A pack year of smoking is smoking an average of 1 pack of cigarettes a day for 1 year (for example: 1 pack a day for 30 years or 2 packs a day for 15 years). Yearly screening should continue until the smoker has stopped smoking for at least 15 years. Yearly screening should be stopped for people who develop a health problem that would prevent them from having lung cancer treatment.  If you are pregnant, do not drink alcohol. If you are breastfeeding,  be very cautious about drinking alcohol. If you are not pregnant and choose to drink alcohol, do not have more than 1 drink per day. One drink is considered to be 12 ounces (355 mL) of beer, 5 ounces (148 mL) of wine, or 1.5 ounces (44 mL) of liquor.  Avoid use of street drugs. Do not share needles with anyone. Ask for help if you need support or instructions about stopping the use of drugs.  High blood pressure causes heart disease and increases the risk of  stroke. Your blood pressure should be checked at least every 1 to 2 years. Ongoing high blood pressure should be treated with medicines if weight loss and exercise do not work.  If you are 3-86 years old, ask your health care provider if you should take aspirin to prevent strokes.  Diabetes screening involves taking a blood sample to check your fasting blood sugar level. This should be done once every 3 years, after age 67, if you are within normal weight and without risk factors for diabetes. Testing should be considered at a younger age or be carried out more frequently if you are overweight and have at least 1 risk factor for diabetes.  Breast cancer screening is essential preventive care for women. You should practice "breast self-awareness." This means understanding the normal appearance and feel of your breasts and may include breast self-examination. Any changes detected, no matter how small, should be reported to a health care provider. Women in their 8s and 30s should have a clinical breast exam (CBE) by a health care provider as part of a regular health exam every 1 to 3 years. After age 70, women should have a CBE every year. Starting at age 25, women should consider having a mammogram (breast X-ray test) every year. Women who have a family history of breast cancer should talk to their health care provider about genetic screening. Women at a high risk of breast cancer should talk to their health care providers about having an MRI and a mammogram every year.  Breast cancer gene (BRCA)-related cancer risk assessment is recommended for women who have family members with BRCA-related cancers. BRCA-related cancers include breast, ovarian, tubal, and peritoneal cancers. Having family members with these cancers may be associated with an increased risk for harmful changes (mutations) in the breast cancer genes BRCA1 and BRCA2. Results of the assessment will determine the need for genetic counseling and  BRCA1 and BRCA2 testing.  Routine pelvic exams to screen for cancer are no longer recommended for nonpregnant women who are considered low risk for cancer of the pelvic organs (ovaries, uterus, and vagina) and who do not have symptoms. Ask your health care provider if a screening pelvic exam is right for you.  If you have had past treatment for cervical cancer or a condition that could lead to cancer, you need Pap tests and screening for cancer for at least 20 years after your treatment. If Pap tests have been discontinued, your risk factors (such as having a new sexual partner) need to be reassessed to determine if screening should be resumed. Some women have medical problems that increase the chance of getting cervical cancer. In these cases, your health care provider may recommend more frequent screening and Pap tests.  The HPV test is an additional test that may be used for cervical cancer screening. The HPV test looks for the virus that can cause the cell changes on the cervix. The cells collected during the Pap test can be  tested for HPV. The HPV test could be used to screen women aged 30 years and older, and should be used in women of any age who have unclear Pap test results. After the age of 30, women should have HPV testing at the same frequency as a Pap test.  Colorectal cancer can be detected and often prevented. Most routine colorectal cancer screening begins at the age of 50 years and continues through age 75 years. However, your health care provider may recommend screening at an earlier age if you have risk factors for colon cancer. On a yearly basis, your health care provider may provide home test kits to check for hidden blood in the stool. Use of a small camera at the end of a tube, to directly examine the colon (sigmoidoscopy or colonoscopy), can detect the earliest forms of colorectal cancer. Talk to your health care provider about this at age 50, when routine screening begins. Direct  exam of the colon should be repeated every 5-10 years through age 75 years, unless early forms of pre-cancerous polyps or small growths are found.  People who are at an increased risk for hepatitis B should be screened for this virus. You are considered at high risk for hepatitis B if:  You were born in a country where hepatitis B occurs often. Talk with your health care provider about which countries are considered high risk.  Your parents were born in a high-risk country and you have not received a shot to protect against hepatitis B (hepatitis B vaccine).  You have HIV or AIDS.  You use needles to inject street drugs.  You live with, or have sex with, someone who has hepatitis B.  You get hemodialysis treatment.  You take certain medicines for conditions like cancer, organ transplantation, and autoimmune conditions.  Hepatitis C blood testing is recommended for all people born from 1945 through 1965 and any individual with known risks for hepatitis C.  Practice safe sex. Use condoms and avoid high-risk sexual practices to reduce the spread of sexually transmitted infections (STIs). STIs include gonorrhea, chlamydia, syphilis, trichomonas, herpes, HPV, and human immunodeficiency virus (HIV). Herpes, HIV, and HPV are viral illnesses that have no cure. They can result in disability, cancer, and death.  You should be screened for sexually transmitted illnesses (STIs) including gonorrhea and chlamydia if:  You are sexually active and are younger than 24 years.  You are older than 24 years and your health care provider tells you that you are at risk for this type of infection.  Your sexual activity has changed since you were last screened and you are at an increased risk for chlamydia or gonorrhea. Ask your health care provider if you are at risk.  If you are at risk of being infected with HIV, it is recommended that you take a prescription medicine daily to prevent HIV infection. This is  called preexposure prophylaxis (PrEP). You are considered at risk if:  You are a heterosexual woman, are sexually active, and are at increased risk for HIV infection.  You take drugs by injection.  You are sexually active with a partner who has HIV.  Talk with your health care provider about whether you are at high risk of being infected with HIV. If you choose to begin PrEP, you should first be tested for HIV. You should then be tested every 3 months for as long as you are taking PrEP.  Osteoporosis is a disease in which the bones lose minerals and strength   with aging. This can result in serious bone fractures or breaks. The risk of osteoporosis can be identified using a bone density scan. Women ages 65 years and over and women at risk for fractures or osteoporosis should discuss screening with their health care providers. Ask your health care provider whether you should take a calcium supplement or vitamin D to reduce the rate of osteoporosis.  Menopause can be associated with physical symptoms and risks. Hormone replacement therapy is available to decrease symptoms and risks. You should talk to your health care provider about whether hormone replacement therapy is right for you.  Use sunscreen. Apply sunscreen liberally and repeatedly throughout the day. You should seek shade when your shadow is shorter than you. Protect yourself by wearing long sleeves, pants, a wide-brimmed hat, and sunglasses year round, whenever you are outdoors.  Once a month, do a whole body skin exam, using a mirror to look at the skin on your back. Tell your health care provider of new moles, moles that have irregular borders, moles that are larger than a pencil eraser, or moles that have changed in shape or color.  Stay current with required vaccines (immunizations).  Influenza vaccine. All adults should be immunized every year.  Tetanus, diphtheria, and acellular pertussis (Td, Tdap) vaccine. Pregnant women should  receive 1 dose of Tdap vaccine during each pregnancy. The dose should be obtained regardless of the length of time since the last dose. Immunization is preferred during the 27th-36th week of gestation. An adult who has not previously received Tdap or who does not know her vaccine status should receive 1 dose of Tdap. This initial dose should be followed by tetanus and diphtheria toxoids (Td) booster doses every 10 years. Adults with an unknown or incomplete history of completing a 3-dose immunization series with Td-containing vaccines should begin or complete a primary immunization series including a Tdap dose. Adults should receive a Td booster every 10 years.  Varicella vaccine. An adult without evidence of immunity to varicella should receive 2 doses or a second dose if she has previously received 1 dose. Pregnant females who do not have evidence of immunity should receive the first dose after pregnancy. This first dose should be obtained before leaving the health care facility. The second dose should be obtained 4-8 weeks after the first dose.  Human papillomavirus (HPV) vaccine. Females aged 13-26 years who have not received the vaccine previously should obtain the 3-dose series. The vaccine is not recommended for use in pregnant females. However, pregnancy testing is not needed before receiving a dose. If a female is found to be pregnant after receiving a dose, no treatment is needed. In that case, the remaining doses should be delayed until after the pregnancy. Immunization is recommended for any person with an immunocompromised condition through the age of 26 years if she did not get any or all doses earlier. During the 3-dose series, the second dose should be obtained 4-8 weeks after the first dose. The third dose should be obtained 24 weeks after the first dose and 16 weeks after the second dose.  Zoster vaccine. One dose is recommended for adults aged 60 years or older unless certain conditions are  present.  Measles, mumps, and rubella (MMR) vaccine. Adults born before 1957 generally are considered immune to measles and mumps. Adults born in 1957 or later should have 1 or more doses of MMR vaccine unless there is a contraindication to the vaccine or there is laboratory evidence of immunity to   each of the three diseases. A routine second dose of MMR vaccine should be obtained at least 28 days after the first dose for students attending postsecondary schools, health care workers, or international travelers. People who received inactivated measles vaccine or an unknown type of measles vaccine during 1963-1967 should receive 2 doses of MMR vaccine. People who received inactivated mumps vaccine or an unknown type of mumps vaccine before 1979 and are at high risk for mumps infection should consider immunization with 2 doses of MMR vaccine. For females of childbearing age, rubella immunity should be determined. If there is no evidence of immunity, females who are not pregnant should be vaccinated. If there is no evidence of immunity, females who are pregnant should delay immunization until after pregnancy. Unvaccinated health care workers born before 1957 who lack laboratory evidence of measles, mumps, or rubella immunity or laboratory confirmation of disease should consider measles and mumps immunization with 2 doses of MMR vaccine or rubella immunization with 1 dose of MMR vaccine.  Pneumococcal 13-valent conjugate (PCV13) vaccine. When indicated, a person who is uncertain of her immunization history and has no record of immunization should receive the PCV13 vaccine. An adult aged 19 years or older who has certain medical conditions and has not been previously immunized should receive 1 dose of PCV13 vaccine. This PCV13 should be followed with a dose of pneumococcal polysaccharide (PPSV23) vaccine. The PPSV23 vaccine dose should be obtained at least 8 weeks after the dose of PCV13 vaccine. An adult aged 19  years or older who has certain medical conditions and previously received 1 or more doses of PPSV23 vaccine should receive 1 dose of PCV13. The PCV13 vaccine dose should be obtained 1 or more years after the last PPSV23 vaccine dose.  Pneumococcal polysaccharide (PPSV23) vaccine. When PCV13 is also indicated, PCV13 should be obtained first. All adults aged 65 years and older should be immunized. An adult younger than age 65 years who has certain medical conditions should be immunized. Any person who resides in a nursing home or long-term care facility should be immunized. An adult smoker should be immunized. People with an immunocompromised condition and certain other conditions should receive both PCV13 and PPSV23 vaccines. People with human immunodeficiency virus (HIV) infection should be immunized as soon as possible after diagnosis. Immunization during chemotherapy or radiation therapy should be avoided. Routine use of PPSV23 vaccine is not recommended for American Indians, [place] Natives, or people younger than 65 years unless there are medical conditions that require PPSV23 vaccine. When indicated, people who have unknown immunization and have no record of immunization should receive PPSV23 vaccine. One-time revaccination 5 years after the first dose of PPSV23 is recommended for people aged 19-64 years who have chronic kidney failure, nephrotic syndrome, asplenia, or immunocompromised conditions. People who received 1-2 doses of PPSV23 before age 65 years should receive another dose of PPSV23 vaccine at age 65 years or later if at least 5 years have passed since the previous dose. Doses of PPSV23 are not needed for people immunized with PPSV23 at or after age 65 years.  Meningococcal vaccine. Adults with asplenia or persistent complement component deficiencies should receive 2 doses of quadrivalent meningococcal conjugate (MenACWY-D) vaccine. The doses should be obtained at least 2 months apart.  Microbiologists working with certain meningococcal bacteria, military recruits, people at risk during an outbreak, and people who travel to or live in countries with a high rate of meningitis should be immunized. A first-year college student up through age   21 years who is living in a residence hall should receive a dose if she did not receive a dose on or after her 16th birthday. Adults who have certain high-risk conditions should receive one or more doses of vaccine.  Hepatitis A vaccine. Adults who wish to be protected from this disease, have certain high-risk conditions, work with hepatitis A-infected animals, work in hepatitis A research labs, or travel to or work in countries with a high rate of hepatitis A should be immunized. Adults who were previously unvaccinated and who anticipate close contact with an international adoptee during the first 60 days after arrival in the [place] from a country with a high rate of hepatitis A should be immunized.  Hepatitis B vaccine. Adults who wish to be protected from this disease, have certain high-risk conditions, may be exposed to blood or other infectious body fluids, are household contacts or sex partners of hepatitis B positive people, are clients or workers in certain care facilities, or travel to or work in countries with a high rate of hepatitis B should be immunized.  Haemophilus influenzae type b (Hib) vaccine. A previously unvaccinated person with asplenia or sickle cell disease or having a scheduled splenectomy should receive 1 dose of Hib vaccine. Regardless of previous immunization, a recipient of a hematopoietic stem cell transplant should receive a 3-dose series 6-12 months after her successful transplant. Hib vaccine is not recommended for adults with HIV infection. Preventive Services / Frequency Ages 64 to 68 years  Blood pressure check.** / Every 1 to 2 years.  Lipid and cholesterol check.** / Every 5 years beginning at age  22.  Clinical breast exam.** / Every 3 years for women in their 88s and 53s.  BRCA-related cancer risk assessment.** / For women who have family members with a BRCA-related cancer (breast, ovarian, tubal, or peritoneal cancers).  Pap test.** / Every 2 years from ages 90 through 51. Every 3 years starting at age 21 through age 56 or 3 with a history of 3 consecutive normal Pap tests.  HPV screening.** / Every 3 years from ages 24 through ages 1 to 46 with a history of 3 consecutive normal Pap tests.  Hepatitis C blood test.** / For any individual with known risks for hepatitis C.  Skin self-exam. / Monthly.  Influenza vaccine. / Every year.  Tetanus, diphtheria, and acellular pertussis (Tdap, Td) vaccine.** / Consult your health care provider. Pregnant women should receive 1 dose of Tdap vaccine during each pregnancy. 1 dose of Td every 10 years.  Varicella vaccine.** / Consult your health care provider. Pregnant females who do not have evidence of immunity should receive the first dose after pregnancy.  HPV vaccine. / 3 doses over 6 months, if 72 and younger. The vaccine is not recommended for use in pregnant females. However, pregnancy testing is not needed before receiving a dose.  Measles, mumps, rubella (MMR) vaccine.** / You need at least 1 dose of MMR if you were born in 1957 or later. You may also need a 2nd dose. For females of childbearing age, rubella immunity should be determined. If there is no evidence of immunity, females who are not pregnant should be vaccinated. If there is no evidence of immunity, females who are pregnant should delay immunization until after pregnancy.  Pneumococcal 13-valent conjugate (PCV13) vaccine.** / Consult your health care provider.  Pneumococcal polysaccharide (PPSV23) vaccine.** / 1 to 2 doses if you smoke cigarettes or if you have certain conditions.  Meningococcal vaccine.** /  1 dose if you are age 19 to 21 years and a first-year college  student living in a residence hall, or have one of several medical conditions, you need to get vaccinated against meningococcal disease. You may also need additional booster doses.  Hepatitis A vaccine.** / Consult your health care provider.  Hepatitis B vaccine.** / Consult your health care provider.  Haemophilus influenzae type b (Hib) vaccine.** / Consult your health care provider. Ages 40 to 64 years  Blood pressure check.** / Every 1 to 2 years.  Lipid and cholesterol check.** / Every 5 years beginning at age 20 years.  Lung cancer screening. / Every year if you are aged 55-80 years and have a 30-pack-year history of smoking and currently smoke or have quit within the past 15 years. Yearly screening is stopped once you have quit smoking for at least 15 years or develop a health problem that would prevent you from having lung cancer treatment.  Clinical breast exam.** / Every year after age 40 years.  BRCA-related cancer risk assessment.** / For women who have family members with a BRCA-related cancer (breast, ovarian, tubal, or peritoneal cancers).  Mammogram.** / Every year beginning at age 40 years and continuing for as long as you are in good health. Consult with your health care provider.  Pap test.** / Every 3 years starting at age 30 years through age 65 or 70 years with a history of 3 consecutive normal Pap tests.  HPV screening.** / Every 3 years from ages 30 years through ages 65 to 70 years with a history of 3 consecutive normal Pap tests.  Fecal occult blood test (FOBT) of stool. / Every year beginning at age 50 years and continuing until age 75 years. You may not need to do this test if you get a colonoscopy every 10 years.  Flexible sigmoidoscopy or colonoscopy.** / Every 5 years for a flexible sigmoidoscopy or every 10 years for a colonoscopy beginning at age 50 years and continuing until age 75 years.  Hepatitis C blood test.** / For all people born from 1945 through  1965 and any individual with known risks for hepatitis C.  Skin self-exam. / Monthly.  Influenza vaccine. / Every year.  Tetanus, diphtheria, and acellular pertussis (Tdap/Td) vaccine.** / Consult your health care provider. Pregnant women should receive 1 dose of Tdap vaccine during each pregnancy. 1 dose of Td every 10 years.  Varicella vaccine.** / Consult your health care provider. Pregnant females who do not have evidence of immunity should receive the first dose after pregnancy.  Zoster vaccine.** / 1 dose for adults aged 60 years or older.  Measles, mumps, rubella (MMR) vaccine.** / You need at least 1 dose of MMR if you were born in 1957 or later. You may also need a 2nd dose. For females of childbearing age, rubella immunity should be determined. If there is no evidence of immunity, females who are not pregnant should be vaccinated. If there is no evidence of immunity, females who are pregnant should delay immunization until after pregnancy.  Pneumococcal 13-valent conjugate (PCV13) vaccine.** / Consult your health care provider.  Pneumococcal polysaccharide (PPSV23) vaccine.** / 1 to 2 doses if you smoke cigarettes or if you have certain conditions.  Meningococcal vaccine.** / Consult your health care provider.  Hepatitis A vaccine.** / Consult your health care provider.  Hepatitis B vaccine.** / Consult your health care provider.  Haemophilus influenzae type b (Hib) vaccine.** / Consult your health care provider. Ages 65   years and over  Blood pressure check.** / Every 1 to 2 years.  Lipid and cholesterol check.** / Every 5 years beginning at age 22 years.  Lung cancer screening. / Every year if you are aged 73-80 years and have a 30-pack-year history of smoking and currently smoke or have quit within the past 15 years. Yearly screening is stopped once you have quit smoking for at least 15 years or develop a health problem that would prevent you from having lung cancer  treatment.  Clinical breast exam.** / Every year after age 4 years.  BRCA-related cancer risk assessment.** / For women who have family members with a BRCA-related cancer (breast, ovarian, tubal, or peritoneal cancers).  Mammogram.** / Every year beginning at age 40 years and continuing for as long as you are in good health. Consult with your health care provider.  Pap test.** / Every 3 years starting at age 9 years through age 34 or 91 years with 3 consecutive normal Pap tests. Testing can be stopped between 65 and 70 years with 3 consecutive normal Pap tests and no abnormal Pap or HPV tests in the past 10 years.  HPV screening.** / Every 3 years from ages 57 years through ages 64 or 45 years with a history of 3 consecutive normal Pap tests. Testing can be stopped between 65 and 70 years with 3 consecutive normal Pap tests and no abnormal Pap or HPV tests in the past 10 years.  Fecal occult blood test (FOBT) of stool. / Every year beginning at age 15 years and continuing until age 17 years. You may not need to do this test if you get a colonoscopy every 10 years.  Flexible sigmoidoscopy or colonoscopy.** / Every 5 years for a flexible sigmoidoscopy or every 10 years for a colonoscopy beginning at age 86 years and continuing until age 71 years.  Hepatitis C blood test.** / For all people born from [birth year] through 1967 and any individual with known risks for hepatitis C.  Osteoporosis screening.** / A one-time screening for women ages 83 years and over and women at risk for fractures or osteoporosis.  Skin self-exam. / Monthly.  Influenza vaccine. / Every year.  Tetanus, diphtheria, and acellular pertussis (Tdap/Td) vaccine.** / 1 dose of Td every 10 years.  Varicella vaccine.** / Consult your health care provider.  Zoster vaccine.** / 1 dose for adults aged 61 years or older.  Pneumococcal 13-valent conjugate (PCV13) vaccine.** / Consult your health care provider.  Pneumococcal  polysaccharide (PPSV23) vaccine.** / 1 dose for all adults aged 28 years and older.  Meningococcal vaccine.** / Consult your health care provider.  Hepatitis A vaccine.** / Consult your health care provider.  Hepatitis B vaccine.** / Consult your health care provider.  Haemophilus influenzae type b (Hib) vaccine.** / Consult your health care provider. ** Family history and personal history of risk and conditions may change your health care provider's recommendations. Document Released: 01/31/2002 Document Revised: 04/21/2014 Document Reviewed: 05/02/2011 [facility] Patient Information 2015 [place], [place]. This information is not intended to replace advice given to you by your health care provider. Make sure you discuss any questions you have with your health care provider.

## 2015-04-29 LAB — CBC
HCT: 40 % (ref 36.0–46.0)
HEMOGLOBIN: 13.4 g/dL (ref 12.0–15.0)
MCH: 28.8 pg (ref 26.0–34.0)
MCHC: 33.5 g/dL (ref 30.0–36.0)
MCV: 85.8 fL (ref 78.0–100.0)
MPV: 9.7 fL (ref 8.6–12.4)
PLATELETS: 307 10*3/uL (ref 150–400)
RBC: 4.66 MIL/uL (ref 3.87–5.11)
RDW: 13.7 % (ref 11.5–15.5)
WBC: 6 10*3/uL (ref 4.0–10.5)

## 2015-04-29 LAB — COMPREHENSIVE METABOLIC PANEL
ALBUMIN: 4.2 g/dL (ref 3.5–5.2)
ALK PHOS: 105 U/L (ref 39–117)
ALT: 17 U/L (ref 0–35)
AST: 19 U/L (ref 0–37)
BUN: 11 mg/dL (ref 6–23)
CO2: 25 mEq/L (ref 19–32)
CREATININE: 0.66 mg/dL (ref 0.50–1.10)
Calcium: 9.4 mg/dL (ref 8.4–10.5)
Chloride: 104 mEq/L (ref 96–112)
Glucose, Bld: 87 mg/dL (ref 70–99)
POTASSIUM: 3.9 meq/L (ref 3.5–5.3)
Sodium: 140 mEq/L (ref 135–145)
Total Bilirubin: 0.4 mg/dL (ref 0.2–1.2)
Total Protein: 7.4 g/dL (ref 6.0–8.3)

## 2015-04-29 LAB — CYTOLOGY - PAP

## 2015-04-29 LAB — LIPID PANEL
CHOLESTEROL: 296 mg/dL — AB (ref 0–200)
HDL: 36 mg/dL — AB (ref >=46)
LDL CALC: 192 mg/dL — AB (ref 0–99)
TRIGLYCERIDES: 338 mg/dL — AB (ref <150)
Total CHOL/HDL Ratio: 8.2 Ratio
VLDL: 68 mg/dL — AB (ref 0–40)

## 2015-04-29 LAB — HEPATITIS C ANTIBODY: HCV AB: NEGATIVE

## 2015-04-29 LAB — TSH: TSH: 2.75 u[IU]/mL (ref 0.350–4.500)

## 2015-05-05 ENCOUNTER — Encounter: Payer: Self-pay | Admitting: Primary Care

## 2015-05-05 ENCOUNTER — Ambulatory Visit (INDEPENDENT_AMBULATORY_CARE_PROVIDER_SITE_OTHER): Payer: No Typology Code available for payment source | Admitting: Primary Care

## 2015-05-05 VITALS — BP 120/76 | HR 72 | Temp 97.7°F | Ht 65.0 in | Wt 171.2 lb

## 2015-05-05 DIAGNOSIS — E7800 Pure hypercholesterolemia, unspecified: Secondary | ICD-10-CM | POA: Insufficient documentation

## 2015-05-05 DIAGNOSIS — Z1239 Encounter for other screening for malignant neoplasm of breast: Secondary | ICD-10-CM

## 2015-05-05 DIAGNOSIS — Z1211 Encounter for screening for malignant neoplasm of colon: Secondary | ICD-10-CM | POA: Diagnosis not present

## 2015-05-05 NOTE — Assessment & Plan Note (Signed)
As evidenced by labs that were obtained on 04/28/15 from GYN (Dr. Shawnie PonsPratt) She declines initiation of medication, although strongly recommended.  She will try to work very hard on diet and exercise for the next 3 months. Will re-evaluate at that time and if no improvement, will initiate statin therapy. Added Aspirin 81 mg daily.

## 2015-05-05 NOTE — Progress Notes (Signed)
Pre visit review using our clinic review tool, if applicable. No additional management support is needed unless otherwise documented below in the visit note. 

## 2015-05-05 NOTE — Patient Instructions (Addendum)
You will be contacted regarding your referral to GI for your colonoscopy.  Please let us know if you have not heard back within one week.  Your OB/GYN has already placed a request for a screening mammogram. Please follow up with their office if you've not heard anything in the next week. Start taking Aspirin 81 mg daily for prevention of blockages to your vessels. Follow up in three months for your cholesterol. Schedule a lab appointment fasting at your convenience 2-3 days before your appointment. It was a pleasure to meet you today! Please don't hesitate to call me with any questions. Welcome to [facility]!  High Cholesterol High cholesterol refers to having a high level of cholesterol in your blood. Cholesterol is a white, waxy, fat-like protein that your body needs in small amounts. Your liver makes all the cholesterol you need. Excess cholesterol comes from the food you eat. Cholesterol travels in your bloodstream through your blood vessels. If you have high cholesterol, deposits (plaque) may build up on the walls of your blood vessels. This makes the arteries narrower and stiffer. Plaque increases your risk of heart attack and stroke. Work with your health care provider to keep your cholesterol levels in a healthy range. RISK FACTORS Several things can make you more likely to have high cholesterol. These include:   Eating foods high in animal fat (saturated fat) or cholesterol.  Being overweight.  Not getting enough exercise.  Having a family history of high cholesterol. SIGNS AND SYMPTOMS High cholesterol does not cause symptoms. DIAGNOSIS  Your health care provider can do a blood test to check whether you have high cholesterol. If you are older than 20, your health care provider may check your cholesterol every 4-6 years. You may be checked more often if you already have high cholesterol or other risk factors for heart disease. The blood test for cholesterol measures the  following:  Bad cholesterol (LDL cholesterol). This is the type of cholesterol that causes heart disease. This number should be less than 100.  Good cholesterol (HDL cholesterol). This type helps protect against heart disease. A healthy level of HDL cholesterol is 60 or higher.  Total cholesterol. This is the combined number of LDL cholesterol and HDL cholesterol. A healthy number is less than 200. TREATMENT  High cholesterol can be treated with diet changes, lifestyle changes, and medicine.   Diet changes may include eating more whole grains, fruits, vegetables, nuts, and fish. You may also have to cut back on red meat and foods with a lot of added sugar.  Lifestyle changes may include getting at least 40 minutes of aerobic exercise three times a week. Aerobic exercises include walking, biking, and swimming. Aerobic exercise along with a healthy diet can help you maintain a healthy weight. Lifestyle changes may also include quitting smoking.  If diet and lifestyle changes are not enough to lower your cholesterol, your health care provider may prescribe a statin medicine. This medicine has been shown to lower cholesterol and also lower the risk of heart disease. HOME CARE INSTRUCTIONS  Only take over-the-counter or prescription medicines as directed by your health care provider.   Follow a healthy diet as directed by your health care provider. For instance:   Eat chicken (without skin), fish, veal, shellfish, ground Malaw[place]urkey breast, and round or loin cuts of red meat.  Do not eat fried foods and fatty meats, such as hot dogs and salami.   Eat plenty of fruits, such as apples.   Eat plenty  of vegetables, such as broccoli, potatoes, and carrots.   Eat beans, peas, and lentils.   Eat grains, such as barley, rice, couscous, and bulgur wheat.   Eat pasta without cream sauces.   Use skim or nonfat milk and low-fat or nonfat yogurt and cheeses. Do not eat or drink whole milk, cream,  ice cream, egg yolks, and hard cheeses.   Do not eat stick margarine or tub margarines that contain trans fats (also called partially hydrogenated oils).   Do not eat cakes, cookies, crackers, or other baked goods that contain trans fats.   Do not eat saturated tropical oils, such as coconut and palm oil.   Exercise as directed by your health care provider. Increase your activity level with activities such as gardening or walking.   Keep all follow-up appointments.  SEEK MEDICAL CARE IF:  You are struggling to maintain a healthy diet or weight.  You need help starting an exercise program.  You need help to stop smoking. SEEK IMMEDIATE MEDICAL CARE IF:  You have chest pain.  You have trouble breathing. Document Released: 12/05/2005 Document Revised: 04/21/2014 Document Reviewed: 09/27/2013 [facility] Patient Information 2015 [place]. This information is not intended to replace advice given to you by your health care provider. Make sure you discuss any questions you have with your health care provider.

## 2015-05-05 NOTE — Progress Notes (Signed)
Subjective:    Patient ID: Leah JubaSherry H Blankenship, female    DOB: February 10, 1961, 54 y.o.   MRN: [id number]  HPI  Leah Blankenship is a 54 year old female who presents today to establish care and discuss the problems mentioned below. Has not seen a PCP in years, but will review records from [facility].  1) Hyperlipidemia: Diagnosed at age 54, as she has a strong family history. She's been able to maintain her cholesterol through diet and exercise. Three years ago she gained weight and was up to 200 pounds. She lost weight over the next year and was down to 154 pounds. Throughout the past several years she slowly gained weight and is now up to 170 pounds. She is aware of how to lose weight including the proper foods and the addition of exercise. She's improved her diet over the past 2 weeks which consists of: egg whites, chicken, salads, limited red meat. She drinks mostly water and coffee.  Prior to the improvement in her diet she was eating out at restaurants and fast food. She has not been exercising, but has recently started walking in the park. She has never has been managed on medicaiton in the past, and came with labs from her OB/GYN including lipid panel, BMP, and CBC. Her total cholesterol is 296, Triglycerides: 338, HDL: 36, LDL:192. She is not taking a daily aspirin.  Review of Systems  Constitutional: Negative for fatigue and unexpected weight change.  HENT: Negative for rhinorrhea.   Respiratory: Negative for cough and shortness of breath.   Cardiovascular: Negative for chest pain.  Gastrointestinal: Negative for diarrhea, constipation and blood in stool.  Genitourinary: Negative for dysuria and frequency.  Musculoskeletal: Negative for myalgias and arthralgias.  Skin: Negative for rash.  Allergic/Immunologic: Negative for environmental allergies.  Neurological: Negative for dizziness and headaches.  Hematological: Negative for adenopathy.  Psychiatric/Behavioral:       Denies concerns for  anxiety or depression       Past Medical History  Diagnosis Date  . Hyperlipidemia   . Gestational diabetes     History   Social History  . Marital Status: Married    Spouse Name: N/A  . Number of Children: N/A  . Years of Education: N/A   Occupational History  . Not on file.   Social History Main Topics  . Smoking status: Never Smoker   . Smokeless tobacco: Never Used  . Alcohol Use: 0.6 oz/week    1 Standard drinks or equivalent per week  . Drug Use: No  . Sexual Activity: No     Comment: separated   Other Topics Concern  . Not on file   Social History Narrative   Single.   Highest level of education 12th grade.   [employer], was previous truck Hospital doctordriver.   1 daughter   Enjoys spending time with her daughter, resting.       Past Surgical History  Procedure Laterality Date  . Fracture surgery  1985    left leg    Family History  Problem Relation Age of Onset  . Heart disease Father     MI and stent placement  . Arthritis Father   . Hyperlipidemia Father   . Hypertension Father   . Cancer Sister     cervical  . Cancer Brother     colon  . Hyperlipidemia Mother     No Known Allergies  No current outpatient prescriptions on file prior to visit.   No  current facility-administered medications on file prior to visit.    BP 120/76 mmHg  Pulse 72  Temp(Src) 97.7 F (36.5 C) (Oral)  Ht 5\' 5"  (1.651 m)  Wt 171 lb 3.2 oz (77.656 kg)  BMI 28.49 kg/m2  SpO2 98%    Objective:   Physical Exam  Constitutional: She is oriented to person, place, and time. She appears well-nourished.  HENT:  Right Ear: Tympanic membrane and ear canal normal.  Left Ear: Tympanic membrane and ear canal normal.  Nose: Nose normal.  Mouth/Throat: Oropharynx is clear and moist.  Moderate cerumen impaction noted bilaterally without visualization of TM's. Cerumen removed using plastic curette. TM's NL.  Eyes: Conjunctivae and EOM are normal. Pupils are equal, round, and  reactive to light.  Neck: Neck supple. No thyromegaly present.  Cardiovascular: Normal rate and regular rhythm.   Pulmonary/Chest: Effort normal and breath sounds normal.  Abdominal: Soft. Bowel sounds are normal.  Lymphadenopathy:    She has no cervical adenopathy.  Neurological: She is alert and oriented to person, place, and time. No cranial nerve deficit.  Skin: Skin is warm and dry.  Psychiatric: She has a normal mood and affect.          Assessment & Plan:  Referral made for Screening Colonoscopy which is due. Mammogram ordered by GYN.

## 2015-07-13 ENCOUNTER — Telehealth: Payer: Self-pay

## 2015-07-13 NOTE — Telephone Encounter (Signed)
Called and spoke with patient, and notified them that they were due for a Mammogram. Patient wanted information on [facility]. Patient requested a letter with the [facility] contact information as well. Patient will call and schedule an appointment. Letter placed up front to be sent.

## 2015-07-31 ENCOUNTER — Other Ambulatory Visit: Payer: Self-pay | Admitting: Primary Care

## 2015-07-31 DIAGNOSIS — E785 Hyperlipidemia, unspecified: Secondary | ICD-10-CM

## 2015-08-04 ENCOUNTER — Other Ambulatory Visit: Payer: No Typology Code available for payment source

## 2015-08-11 ENCOUNTER — Ambulatory Visit: Payer: No Typology Code available for payment source | Admitting: Primary Care

## 2016-03-16 ENCOUNTER — Other Ambulatory Visit: Payer: Self-pay | Admitting: Family Medicine

## 2016-03-16 DIAGNOSIS — Z1231 Encounter for screening mammogram for malignant neoplasm of breast: Secondary | ICD-10-CM

## 2020-03-31 ENCOUNTER — Ambulatory Visit: Payer: No Typology Code available for payment source | Admitting: Primary Care

## 2020-04-22 ENCOUNTER — Other Ambulatory Visit: Payer: Self-pay | Admitting: Primary Care

## 2020-04-22 ENCOUNTER — Other Ambulatory Visit: Payer: Self-pay

## 2020-04-22 ENCOUNTER — Ambulatory Visit (INDEPENDENT_AMBULATORY_CARE_PROVIDER_SITE_OTHER): Payer: 59 | Admitting: Primary Care

## 2020-04-22 ENCOUNTER — Encounter: Payer: Self-pay | Admitting: Primary Care

## 2020-04-22 VITALS — BP 126/82 | HR 80 | Temp 97.2°F | Ht 65.25 in | Wt 145.8 lb

## 2020-04-22 DIAGNOSIS — E7800 Pure hypercholesterolemia, unspecified: Secondary | ICD-10-CM | POA: Diagnosis not present

## 2020-04-22 DIAGNOSIS — E119 Type 2 diabetes mellitus without complications: Secondary | ICD-10-CM

## 2020-04-22 DIAGNOSIS — R739 Hyperglycemia, unspecified: Secondary | ICD-10-CM | POA: Diagnosis not present

## 2020-04-22 LAB — COMPREHENSIVE METABOLIC PANEL
ALT: 27 U/L (ref 0–35)
AST: 19 U/L (ref 0–37)
Albumin: 4.3 g/dL (ref 3.5–5.2)
Alkaline Phosphatase: 120 U/L — ABNORMAL HIGH (ref 39–117)
BUN: 16 mg/dL (ref 6–23)
CO2: 28 mEq/L (ref 19–32)
Calcium: 9.2 mg/dL (ref 8.4–10.5)
Chloride: 100 mEq/L (ref 96–112)
Creatinine, Ser: 0.59 mg/dL (ref 0.40–1.20)
GFR: 103.78 mL/min (ref >60.00)
Glucose, Bld: 322 mg/dL — ABNORMAL HIGH (ref 70–99)
Potassium: 4.1 mEq/L (ref 3.5–5.1)
Sodium: 135 mEq/L (ref 135–145)
Total Bilirubin: 0.6 mg/dL (ref 0.2–1.2)
Total Protein: 6.8 g/dL (ref 6.0–8.3)

## 2020-04-22 LAB — POCT GLYCOSYLATED HEMOGLOBIN (HGB A1C): Hemoglobin A1C: 11.9 % — AB (ref 4.0–5.6)

## 2020-04-22 LAB — CBC
HCT: 40.8 % (ref 36.0–46.0)
Hemoglobin: 14.1 g/dL (ref 12.0–15.0)
MCHC: 34.6 g/dL (ref 30.0–36.0)
MCV: 89 fl (ref 78.0–100.0)
Platelets: 221 10*3/uL (ref 150.0–400.0)
RBC: 4.59 Mil/uL (ref 3.87–5.11)
RDW: 12.8 % (ref 11.5–15.5)
WBC: 3.2 10*3/uL — ABNORMAL LOW (ref 4.0–10.5)

## 2020-04-22 LAB — LIPID PANEL
Cholesterol: 338 mg/dL — ABNORMAL HIGH (ref 0–200)
HDL: 41.9 mg/dL (ref >39.00)
NonHDL: 295.85
Total CHOL/HDL Ratio: 8
Triglycerides: 314 mg/dL — ABNORMAL HIGH (ref 0.0–149.0)
VLDL: 62.8 mg/dL — ABNORMAL HIGH (ref 0.0–40.0)

## 2020-04-22 LAB — MICROALBUMIN / CREATININE URINE RATIO
Creatinine,U: 35.7 mg/dL
Microalb Creat Ratio: 12.9 mg/g (ref 0.0–30.0)
Microalb, Ur: 4.6 mg/dL — ABNORMAL HIGH (ref 0.0–1.9)

## 2020-04-22 LAB — LDL CHOLESTEROL, DIRECT: Direct LDL: 202 mg/dL

## 2020-04-22 MED ORDER — PEN NEEDLES 31G X 6 MM MISC: 2 refills | Status: DC

## 2020-04-22 MED ORDER — METFORMIN HCL ER 750 MG PO TB24
750.0000 mg | ORAL_TABLET | Freq: Every day | ORAL | 3 refills | Status: DC
Start: 2020-04-22 — End: 2020-11-04

## 2020-04-22 MED ORDER — LANTUS SOLOSTAR 100 UNIT/ML ~~LOC~~ SOPN: 10.0000 [IU] | PEN_INJECTOR | Freq: Every day | SUBCUTANEOUS | 1 refills | Status: DC

## 2020-04-22 NOTE — Progress Notes (Signed)
Subjective:    Patient ID: Leah Blankenship, female    DOB: 05/24/1961, 59 y.o.   MRN: [id number]  HPI  This visit occurred during the SARS-CoV-2 public health emergency.  Safety protocols were in place, including screening questions prior to the visit, additional usage of staff PPE, and extensive cleaning of exam room while observing appropriate contact time as indicated for disinfecting solutions.   Ms. Leah Blankenship is a 59 year old female with a history of hyperlipidemia who presents today to re-establish care. She has not been seen in our clinic since 2016.   1) Hyperglycemia: Symptoms of blurred vision, weight loss, increased urinary frequency. Her brother-in-law has type 2 diabetes so she's been checking her blood glucose level on his meter and has been getting readings ranging 200-high 300's.   She lost her insurance and started taking a supplement to lower glucose for about 8 months, lower glucose level she saw was around 150. She has a family history of diabetes in paternal grandmother.   She's been working on her diet over the years by limiting her carb intake. She's not done so well over the last 2 weeks.  2) Hyperlipidemia: Diagnosed several years ago. Last lipid panel on file with TC of 296, HDL of 36, and LDL of 192. She has a family history of heart disease in her brother and father.   BP Readings from Last 3 Encounters:  04/22/20 126/82  05/05/15 120/76  04/28/15 115/83   Wt Readings from Last 3 Encounters:  04/22/20 145 lb 12 oz (66.1 kg)  05/05/15 171 lb 3.2 oz (77.7 kg)  04/28/15 170 lb 6.4 oz (77.3 kg)     Review of Systems  Eyes: Positive for visual disturbance.  Respiratory: Negative for shortness of breath.   Cardiovascular: Negative for chest pain.  Endocrine: Positive for polyuria.  Neurological: Negative for dizziness, numbness and headaches.       Past Medical History:  Diagnosis Date  . Gestational diabetes   . Hyperlipidemia      Social History    Socioeconomic History  . Marital status: Married    Spouse name: Not on file  . Number of children: Not on file  . Years of education: Not on file  . Highest education level: Not on file  Occupational History  . Not on file  Tobacco Use  . Smoking status: Never Smoker  . Smokeless tobacco: Never Used  Substance and Sexual Activity  . Alcohol use: Yes    Alcohol/week: 1.0 standard drinks    Types: 1 Standard drinks or equivalent per week  . Drug use: No  . Sexual activity: Never    Comment: separated  Other Topics Concern  . Not on file  Social History Narrative   Single.   Highest level of education 12th grade.   [employer], was previous truck Geophysicist/field seismologist.   1 daughter   Enjoys spending time with her daughter, resting.   Social Determinants of Health   Financial Resource Strain:   . Difficulty of Paying Living Expenses:   Food Insecurity:   . Worried About Charity fundraiser in the Last Year:   . Arboriculturist in the Last Year:   Transportation Needs:   . Film/video editor (Medical):   Marland Kitchen Lack of Transportation (Non-Medical):   Physical Activity:   . Days of Exercise per Week:   . Minutes of Exercise per Session:   Stress:   . Feeling of Stress :  Social Connections:   . Frequency of Communication with Friends and Family:   . Frequency of Social Gatherings with Friends and Family:   . Attends Religious Services:   . Active Member of Clubs or Organizations:   . Attends Banker Meetings:   Marland Kitchen Marital Status:   Intimate Partner Violence:   . Fear of Current or Ex-Partner:   . Emotionally Abused:   Marland Kitchen Physically Abused:   . Sexually Abused:     Past Surgical History:  Procedure Laterality Date  . FRACTURE SURGERY  1985   left leg    Family History  Problem Relation Age of Onset  . Heart disease Father        MI and stent placement  . Arthritis Father   . Hyperlipidemia Father   . Hypertension Father   . Cancer Sister        cervical   . Cancer Brother        colon  . Hyperlipidemia Mother     No Known Allergies  No current outpatient medications on file prior to visit.   No current facility-administered medications on file prior to visit.    BP 126/82   Pulse 80   Temp (!) 97.2 F (36.2 C) (Temporal)   Ht 5' 5.25" (1.657 m)   Wt 145 lb 12 oz (66.1 kg)   SpO2 98%   BMI 24.07 kg/m    Objective:   Physical Exam  Constitutional: She appears well-nourished.  Cardiovascular: Normal rate and regular rhythm.  Respiratory: Effort normal and breath sounds normal.  Musculoskeletal:     Cervical back: Neck supple.  Skin: Skin is warm and dry.  Psychiatric: She has a normal mood and affect.           Assessment & Plan:

## 2020-04-22 NOTE — Patient Instructions (Signed)
Start Lantus insulin 10 units once nightly at bedtime for diabetes. Use the pen needles with the insulin.  Start Metformin XR 750 mg tablets once daily with breakfast for diabetes.   Continue to check your blood sugars as discussed.   Stop by the lab prior to leaving today. I will notify you of your results once received.   Please schedule a follow up appointment in 1 month for diabetes check.  It was a pleasure to meet you today! Please don't hesitate to call or message me with any questions. Welcome back to [facility]!   Diabetes Basics  Diabetes (diabetes mellitus) is a long-term (chronic) disease. It occurs when the body does not properly use sugar (glucose) that is released from food after you eat. Diabetes may be caused by one or both of these problems:  Your pancreas does not make enough of a hormone called insulin.  Your body does not react in a normal way to insulin that it makes. Insulin lets sugars (glucose) go into cells in your body. This gives you energy. If you have diabetes, sugars cannot get into cells. This causes high blood sugar (hyperglycemia). Follow these instructions at home: How is diabetes treated? You may need to take insulin or other diabetes medicines daily to keep your blood sugar in balance. Take your diabetes medicines every day as told by your doctor. List your diabetes medicines here: Diabetes medicines  Name of medicine: ______________________________ ? Amount (dose): _______________ Time (a.m./p.m.): _______________ Notes: ___________________________________  Name of medicine: ______________________________ ? Amount (dose): _______________ Time (a.m./p.m.): _______________ Notes: ___________________________________  Name of medicine: ______________________________ ? Amount (dose): _______________ Time (a.m./p.m.): _______________ Notes: ___________________________________ If you use insulin, you will learn how to give yourself insulin by  injection. You may need to adjust the amount based on the food that you eat. List the types of insulin you use here: Insulin  Insulin type: ______________________________ ? Amount (dose): _______________ Time (a.m./p.m.): _______________ Notes: ___________________________________  Insulin type: ______________________________ ? Amount (dose): _______________ Time (a.m./p.m.): _______________ Notes: ___________________________________  Insulin type: ______________________________ ? Amount (dose): _______________ Time (a.m./p.m.): _______________ Notes: ___________________________________  Insulin type: ______________________________ ? Amount (dose): _______________ Time (a.m./p.m.): _______________ Notes: ___________________________________  Insulin type: ______________________________ ? Amount (dose): _______________ Time (a.m./p.m.): _______________ Notes: ___________________________________ How do I manage my blood sugar?  Check your blood sugar levels using a blood glucose monitor as directed by your doctor. Your doctor will set treatment goals for you. Generally, you should have these blood sugar levels:  Before meals (preprandial): 80-130 mg/dL (0.8-6.7 mmol/L).  After meals (postprandial): below 180 mg/dL (10 mmol/L).  A1c level: less than 7%. Write down the times that you will check your blood sugar levels: Blood sugar checks  Time: _______________ Notes: ___________________________________  Time: _______________ Notes: ___________________________________  Time: _______________ Notes: ___________________________________  Time: _______________ Notes: ___________________________________  Time: _______________ Notes: ___________________________________  Time: _______________ Notes: ___________________________________  What do I need to know about low blood sugar? Low blood sugar is called hypoglycemia. This is when blood sugar is at or below 70 mg/dL (3.9 mmol/L).  Symptoms may include:  Feeling: ? Hungry. ? Worried or nervous (anxious). ? Sweaty and clammy. ? Confused. ? Dizzy. ? Sleepy. ? Sick to your stomach (nauseous).  Having: ? A fast heartbeat. ? A headache. ? A change in your vision. ? Tingling or no feeling (numbness) around the mouth, lips, or tongue. ? Jerky movements that you cannot control (seizure).  Having trouble with: ? Moving (coordination). ? Sleeping. ? Passing out (  fainting). ? Getting upset easily (irritability). Treating low blood sugar To treat low blood sugar, eat or drink something sugary right away. If you can think clearly and swallow safely, follow the 15:15 rule:  Take 15 grams of a fast-acting carb (carbohydrate). Talk with your doctor about how much you should take.  Some fast-acting carbs are: ? Sugar tablets (glucose pills). Take 3-4 glucose pills. ? 6-8 pieces of hard candy. ? 4-6 oz (120-150 mL) of fruit juice. ? 4-6 oz (120-150 mL) of regular (not diet) soda. ? 1 Tbsp (15 mL) honey or sugar.  Check your blood sugar 15 minutes after you take the carb.  If your blood sugar is still at or below 70 mg/dL (3.9 mmol/L), take 15 grams of a carb again.  If your blood sugar does not go above 70 mg/dL (3.9 mmol/L) after 3 tries, get help right away.  After your blood sugar goes back to normal, eat a meal or a snack within 1 hour. Treating very low blood sugar If your blood sugar is at or below 54 mg/dL (3 mmol/L), you have very low blood sugar (severe hypoglycemia). This is an emergency. Do not wait to see if the symptoms will go away. Get medical help right away. Call your local emergency services (911 in the [place] Do not drive yourself to the hospital. Questions to ask your health care provider  Do I need to meet with a diabetes educator?  What equipment will I need to care for myself at home?  What diabetes medicines do I need? When should I take them?  How often do I need to check my blood  sugar?  What number can I call if I have questions?  When is my next doctor's visit?  Where can I find a support group for people with diabetes? Where to find more information  American Diabetes Association: www.diabetes.org  American Association of Diabetes Educators: www.diabeteseducator.org/patient-resources Contact a doctor if:  Your blood sugar is at or above 240 mg/dL (13.3 mmol/L) for 2 days in a row.  You have been sick or have had a fever for 2 days or more, and you are not getting better.  You have any of these problems for more than 6 hours: ? You cannot eat or drink. ? You feel sick to your stomach (nauseous). ? You throw up (vomit). ? You have watery poop (diarrhea). Get help right away if:  Your blood sugar is lower than 54 mg/dL (3 mmol/L).  You get confused.  You have trouble: ? Thinking clearly. ? Breathing. Summary  Diabetes (diabetes mellitus) is a long-term (chronic) disease. It occurs when the body does not properly use sugar (glucose) that is released from food after digestion.  Take insulin and diabetes medicines as told.  Check your blood sugar every day, as often as told.  Keep all follow-up visits as told by your doctor. This is important. This information is not intended to replace advice given to you by your health care provider. Make sure you discuss any questions you have with your health care provider. Document Revised: 08/28/2019 Document Reviewed: 03/09/2018 Elsevier Patient Education  [place].

## 2020-04-22 NOTE — Assessment & Plan Note (Addendum)
New diagnosis as of today with A1C of 11.9.  Discussed options for treatment and that the best treatment is to start insulin with a A1C of 11.9, she agrees. Rx for Lantus 10 units sent to pharmacy with pen needles.  Rx for Metformin XR 750 mg sent to pharmacy. Discussed potential side effects.   Discussed needs for dietary changes, exercise.  Urine microalbumin and lipid panel pending today. Will likely need statin therapy.  Foot exam today. Will discuss pneumonia vaccination at next visit. Discussed to schedule an eye exam.  Follow up in 1 month with glucose logs.

## 2020-04-22 NOTE — Assessment & Plan Note (Signed)
Repeat LDL pending, will likely need statin therapy given diabetes. Await results.

## 2020-04-23 ENCOUNTER — Telehealth: Payer: Self-pay | Admitting: Primary Care

## 2020-04-23 DIAGNOSIS — E7800 Pure hypercholesterolemia, unspecified: Secondary | ICD-10-CM

## 2020-04-23 MED ORDER — BLOOD GLUCOSE METER KIT: PACK | 0 refills | Status: AC

## 2020-04-23 MED ORDER — ROSUVASTATIN CALCIUM 20 MG PO TABS: 20.0000 mg | ORAL_TABLET | Freq: Every evening | ORAL | 3 refills | Status: DC

## 2020-04-23 NOTE — Telephone Encounter (Signed)
Patient called. She stated she went to the pharmacy and picked up her Diabetic medications She thought that a cholesterol medication was going to be sent also but the pharmacy did not receive anything.   Patient also stated that she went to check her BS this morning and her meter is not longer working. She would like to know if a script for a meter could be sent  Patient stated the meter she has been using is relion meter that she bought from [employer]

## 2020-04-23 NOTE — Telephone Encounter (Signed)
Spoken and notified patient of Leah Blankenship comments. Patient verbalized understanding.  Faxed Rx for meter to [facility]

## 2020-04-23 NOTE — Telephone Encounter (Signed)
I was waiting for the result note regarding approval for Crestor. Will send in Crestor now. Vallarie Mare, will you contact patient and notify her this was done. Also notify her that we will send in Rx for glucometer kit.

## 2020-04-30 ENCOUNTER — Telehealth: Payer: Self-pay | Admitting: Primary Care

## 2020-04-30 NOTE — Telephone Encounter (Signed)
What are her blood sugars running, and what time of day is she checking? The Lantus insulin lasts 24 hours so she should be able to check her blood sugars during the day.

## 2020-04-30 NOTE — Telephone Encounter (Signed)
Please notify patient that in order to prevent further damage we need to get the blood sugar readings down in a timely manner. I believe that this is a safe/effective way and that she is making great progress! Her body is used to her glucose readings being high, so there's an adjustment period that will occur for her body to get used to the improved readings.  If she's very worried then she can move her Lantus to morning.  Have her notify me if she sees drops in her blood sugar consistently below 90. I am more than willing to meet with her sooner than June if she prefers.

## 2020-04-30 NOTE — Telephone Encounter (Signed)
Patient called in regards to her Diabetic medications   He stated that at p/mshe is taking the Lantus and then her metformin in the a/m She stated that when she checks her blood sugar at night it is dropping 64 points , Patient thought you had mentioned that if it does this she may need to back off the medications.  Patient would like to know what she should do.  She said some other family members have diabetes and they take there insulin in the am so they are able to monitor their blood sugars   Please advise

## 2020-04-30 NOTE — Telephone Encounter (Signed)
Spoken to patient and she stated that she checks her blood sugar 5 times a day. Last night it was 212 and this morning it was 142.  Patient stated that she feels bad and and concern that readings are dropping too fast.

## 2020-05-01 NOTE — Telephone Encounter (Signed)
Spoken and notified patient of Kate Clark's comments. Patient verbalized understanding.  

## 2020-05-08 ENCOUNTER — Telehealth: Payer: Self-pay | Admitting: Primary Care

## 2020-05-08 MED ORDER — ONETOUCH VERIO VI STRP: ORAL_STRIP | 5 refills | Status: DC

## 2020-05-08 NOTE — Telephone Encounter (Signed)
Refill as requested and patient have been notified.

## 2020-05-08 NOTE — Telephone Encounter (Signed)
Patient called requesting a refill for her test strips She stated that the strips she had filled would not read and she ended up having to throw several away , Patient said she will not have enough to make it until her next appointment and would like to see if a refill could be sent to her pharmacy   Patient stated she is using the One Touch Vero Reflect meter.   She also wanted to report that her sugars are doing much better now

## 2020-05-20 ENCOUNTER — Ambulatory Visit: Payer: 59 | Admitting: Primary Care

## 2020-06-03 ENCOUNTER — Encounter: Payer: Self-pay | Admitting: Primary Care

## 2020-06-03 ENCOUNTER — Other Ambulatory Visit: Payer: Self-pay

## 2020-06-03 ENCOUNTER — Ambulatory Visit (INDEPENDENT_AMBULATORY_CARE_PROVIDER_SITE_OTHER): Payer: 59 | Admitting: Primary Care

## 2020-06-03 VITALS — BP 120/84 | HR 79 | Temp 95.6°F | Ht 65.25 in | Wt 144.5 lb

## 2020-06-03 DIAGNOSIS — Z1283 Encounter for screening for malignant neoplasm of skin: Secondary | ICD-10-CM

## 2020-06-03 DIAGNOSIS — E7800 Pure hypercholesterolemia, unspecified: Secondary | ICD-10-CM | POA: Diagnosis not present

## 2020-06-03 DIAGNOSIS — E119 Type 2 diabetes mellitus without complications: Secondary | ICD-10-CM | POA: Diagnosis not present

## 2020-06-03 NOTE — Assessment & Plan Note (Signed)
Glucose readings show significant improvement on her current treatment plan. Commended her on compliance and dietary changes.  Continue Lantus 10 units, continue Metformin XR 750. Managed on statin. Urine microalbumin negative. Declines pneumonia vaccine today. Foot exam today. Referral placed to optometry for diabetic eye exam.  Follow up on or after August 5th for recheck of diabetes.

## 2020-06-03 NOTE — Patient Instructions (Signed)
Continue Lantus 10 units at bedtime for diabetes.  Continue Metformin XR 750 mg daily for diabetes.  Continue to check your blood sugars, at least twice daily.  Schedule a follow up visit for on or after August 5th for diabetes check and cholesterol labs.  It was a pleasure to see you today!   Diabetes Mellitus and Nutrition, Adult When you have diabetes (diabetes mellitus), it is very important to have healthy eating habits because your blood sugar (glucose) levels are greatly affected by what you eat and drink. Eating healthy foods in the appropriate amounts, at about the same times every day, can help you:  Control your blood glucose.  Lower your risk of heart disease.  Improve your blood pressure.  Reach or maintain a healthy weight. Every person with diabetes is different, and each person has different needs for a meal plan. Your health care provider may recommend that you work with a diet and nutrition specialist (dietitian) to make a meal plan that is best for you. Your meal plan may vary depending on factors such as:  The calories you need.  The medicines you take.  Your weight.  Your blood glucose, blood pressure, and cholesterol levels.  Your activity level.  Other health conditions you have, such as heart or kidney disease. How do carbohydrates affect me? Carbohydrates, also called carbs, affect your blood glucose level more than any other type of food. Eating carbs naturally raises the amount of glucose in your blood. Carb counting is a method for keeping track of how many carbs you eat. Counting carbs is important to keep your blood glucose at a healthy level, especially if you use insulin or take certain oral diabetes medicines. It is important to know how many carbs you can safely have in each meal. This is different for every person. Your dietitian can help you calculate how many carbs you should have at each meal and for each snack. Foods that contain carbs  include:  Bread, cereal, rice, pasta, and crackers.  Potatoes and corn.  Peas, beans, and lentils.  Milk and yogurt.  Fruit and juice.  Desserts, such as cakes, cookies, ice cream, and candy. How does alcohol affect me? Alcohol can cause a sudden decrease in blood glucose (hypoglycemia), especially if you use insulin or take certain oral diabetes medicines. Hypoglycemia can be a life-threatening condition. Symptoms of hypoglycemia (sleepiness, dizziness, and confusion) are similar to symptoms of having too much alcohol. If your health care provider says that alcohol is safe for you, follow these guidelines:  Limit alcohol intake to no more than 1 drink per day for nonpregnant women and 2 drinks per day for men. One drink equals 12 oz of beer, 5 oz of wine, or 1 oz of hard liquor.  Do not drink on an empty stomach.  Keep yourself hydrated with water, diet soda, or unsweetened iced tea.  Keep in mind that regular soda, juice, and other mixers may contain a lot of sugar and must be counted as carbs. What are tips for following this plan?  Reading food labels  Start by checking the serving size on the "Nutrition Facts" label of packaged foods and drinks. The amount of calories, carbs, fats, and other nutrients listed on the label is based on one serving of the item. Many items contain more than one serving per package.  Check the total grams (g) of carbs in one serving. You can calculate the number of servings of carbs in one serving by  dividing the total carbs by 15. For example, if a food has 30 g of total carbs, it would be equal to 2 servings of carbs.  Check the number of grams (g) of saturated and trans fats in one serving. Choose foods that have low or no amount of these fats.  Check the number of milligrams (mg) of salt (sodium) in one serving. Most people should limit total sodium intake to less than 2,300 mg per day.  Always check the nutrition information of foods labeled  as "low-fat" or "nonfat". These foods may be higher in added sugar or refined carbs and should be avoided.  Talk to your dietitian to identify your daily goals for nutrients listed on the label. Shopping  Avoid buying canned, premade, or processed foods. These foods tend to be high in fat, sodium, and added sugar.  Shop around the outside edge of the grocery store. This includes fresh fruits and vegetables, bulk grains, fresh meats, and fresh dairy. Cooking  Use low-heat cooking methods, such as baking, instead of high-heat cooking methods like deep frying.  Cook using healthy oils, such as olive, canola, or sunflower oil.  Avoid cooking with butter, cream, or high-fat meats. Meal planning  Eat meals and snacks regularly, preferably at the same times every day. Avoid going long periods of time without eating.  Eat foods high in fiber, such as fresh fruits, vegetables, beans, and whole grains. Talk to your dietitian about how many servings of carbs you can eat at each meal.  Eat 4-6 ounces (oz) of lean protein each day, such as lean meat, chicken, fish, eggs, or tofu. One oz of lean protein is equal to: ? 1 oz of meat, chicken, or fish. ? 1 egg. ?  cup of tofu.  Eat some foods each day that contain healthy fats, such as avocado, nuts, seeds, and fish. Lifestyle  Check your blood glucose regularly.  Exercise regularly as told by your health care provider. This may include: ? 150 minutes of moderate-intensity or vigorous-intensity exercise each week. This could be brisk walking, biking, or water aerobics. ? Stretching and doing strength exercises, such as yoga or weightlifting, at least 2 times a week.  Take medicines as told by your health care provider.  Do not use any products that contain nicotine or tobacco, such as cigarettes and e-cigarettes. If you need help quitting, ask your health care provider.  Work with a Social worker or diabetes educator to identify strategies to  manage stress and any emotional and social challenges. Questions to ask a health care provider  Do I need to meet with a diabetes educator?  Do I need to meet with a dietitian?  What number can I call if I have questions?  When are the best times to check my blood glucose? Where to find more information:  American Diabetes Association: diabetes.org  Academy of Nutrition and Dietetics: www.eatright.[employer] of Diabetes and Digestive and Kidney Diseases (NIH): [employer] Summary  A healthy meal plan will help you control your blood glucose and maintain a healthy lifestyle.  Working with a diet and nutrition specialist (dietitian) can help you make a meal plan that is best for you.  Keep in mind that carbohydrates (carbs) and alcohol have immediate effects on your blood glucose levels. It is important to count carbs and to use alcohol carefully. This information is not intended to replace advice given to you by your health care provider. Make sure you discuss any questions you have  with your health care provider. Document Revised: 11/17/2017 Document Reviewed: 01/09/2017 Elsevier Patient Education  2020 [employer].

## 2020-06-03 NOTE — Assessment & Plan Note (Signed)
Compliant to Crestor, repeat lipids and LFT's at next visit.

## 2020-06-03 NOTE — Progress Notes (Signed)
Subjective:    Patient ID: Leah Blankenship, female    DOB: 05-14-1961, 59 y.o.   MRN: [id number]  HPI  This visit occurred during the SARS-CoV-2 public health emergency.  Safety protocols were in place, including screening questions prior to the visit, additional usage of staff PPE, and extensive cleaning of exam room while observing appropriate contact time as indicated for disinfecting solutions.   Leah Blankenship is a 59 year old female with a history of newly diagnosed diabetes, hyperlipidemia who presents today for follow up of diabetes.  She was last evaluated in early May 2021 for symptoms of blurred vision, weight loss, polyuria. She had been checking her blood sugars on her brothers meter and was getting readings of 200-300. A1C during her visit was 11.9. Also with neutropenia with WBC count of 3.2, and LDL of 202. She was initiated on Lantus 10 units HS, Metformin XR 750, and Crestor 20 mg.  Since her last visit she's feeling much better. Vision has improved, she has more energy, less anxiety.   Current medications include: Lantus 10 units HS, Metformin XR 750  She is checking her blood glucose 2-3 times daily and is getting readings of:  AM fasting: low to mid 100's Before Lunch: low to mid 100's Before Dinner: low to mid 100's  Last A1C: 11.9 in May 2021 Last Eye Exam: Due. She doesn't have an eye doctor.  Last Foot Exam: Due Pneumonia Vaccination: Never completed ACE/ARB: None. Urine microalbumin negative Statin: Crestor   BP Readings from Last 3 Encounters:  06/03/20 120/84  04/22/20 126/82  05/05/15 120/76   She would also like a referral to dermatology. She has a history of precancerous lesions that have been removed.    Review of Systems  Eyes: Negative for visual disturbance.  Respiratory: Negative for shortness of breath.   Cardiovascular: Negative for chest pain.  Endocrine: Negative for polyuria.  Neurological: Negative for dizziness and numbness.         Past Medical History:  Diagnosis Date  . Gestational diabetes   . Hyperlipidemia      Social History   Socioeconomic History  . Marital status: Married    Spouse name: Not on file  . Number of children: Not on file  . Years of education: Not on file  . Highest education level: Not on file  Occupational History  . Not on file  Tobacco Use  . Smoking status: Never Smoker  . Smokeless tobacco: Never Used  Substance and Sexual Activity  . Alcohol use: Yes    Alcohol/week: 1.0 standard drink    Types: 1 Standard drinks or equivalent per week  . Drug use: No  . Sexual activity: Never    Comment: separated  Other Topics Concern  . Not on file  Social History Narrative   Single.   Highest level of education 12th grade.   [employer], was previous truck Geophysicist/field seismologist.   1 daughter   Enjoys spending time with her daughter, resting.   Social Determinants of Health   Financial Resource Strain:   . Difficulty of Paying Living Expenses:   Food Insecurity:   . Worried About Charity fundraiser in the Last Year:   . Arboriculturist in the Last Year:   Transportation Needs:   . Film/video editor (Medical):   Marland Kitchen Lack of Transportation (Non-Medical):   Physical Activity:   . Days of Exercise per Week:   . Minutes of Exercise per Session:  Stress:   . Feeling of Stress :   Social Connections:   . Frequency of Communication with Friends and Family:   . Frequency of Social Gatherings with Friends and Family:   . Attends Religious Services:   . Active Member of Clubs or Organizations:   . Attends Archivist Meetings:   Marland Kitchen Marital Status:   Intimate Partner Violence:   . Fear of Current or Ex-Partner:   . Emotionally Abused:   Marland Kitchen Physically Abused:   . Sexually Abused:     Past Surgical History:  Procedure Laterality Date  . FRACTURE SURGERY  1985   left leg    Family History  Problem Relation Age of Onset  . Heart disease Father        MI and stent  placement  . Arthritis Father   . Hyperlipidemia Father   . Hypertension Father   . Cancer Sister        cervical  . Cancer Brother        colon  . Hyperlipidemia Mother     No Known Allergies  Current Outpatient Medications on File Prior to Visit  Medication Sig Dispense Refill  . blood glucose meter kit and supplies Dispense based on patient and insurance preference. Use up to four times daily as directed. (FOR ICD-10 E10.9, E11.9). 1 each 0  . glucose blood (ONETOUCH VERIO) test strip Use as instructed to test blood sugar up to 4 times a daily 100 each 5  . insulin glargine (LANTUS SOLOSTAR) 100 UNIT/ML Solostar Pen Inject 10 Units into the skin at bedtime. For diabetes. 5 pen 1  . Insulin Pen Needle (B-D UF III MINI PEN NEEDLES) 31G X 5 MM MISC Use nightly with insulin 100 each 5  . metFORMIN (GLUCOPHAGE XR) 750 MG 24 hr tablet Take 1 tablet (750 mg total) by mouth daily with breakfast. For diabetes. 90 tablet 3  . rosuvastatin (CRESTOR) 20 MG tablet Take 1 tablet (20 mg total) by mouth every evening. For cholesterol 90 tablet 3   No current facility-administered medications on file prior to visit.    BP 120/84   Pulse 79   Temp (!) 95.6 F (35.3 C) (Temporal)   Ht 5' 5.25" (1.657 m)   Wt 144 lb 8 oz (65.5 kg)   SpO2 98%   BMI 23.86 kg/m    Objective:   Physical Exam  Cardiovascular: Normal rate and regular rhythm.  Respiratory: Effort normal and breath sounds normal.  Musculoskeletal:     Cervical back: Neck supple.  Skin: Skin is warm and dry.           Assessment & Plan:

## 2020-07-10 ENCOUNTER — Other Ambulatory Visit: Payer: Self-pay | Admitting: Primary Care

## 2020-07-22 ENCOUNTER — Ambulatory Visit: Payer: 59 | Admitting: Primary Care

## 2020-07-29 ENCOUNTER — Ambulatory Visit: Payer: 59 | Admitting: Primary Care

## 2020-08-03 ENCOUNTER — Ambulatory Visit: Payer: 59 | Admitting: Primary Care

## 2020-08-18 ENCOUNTER — Encounter: Payer: Self-pay | Admitting: Primary Care

## 2020-08-18 ENCOUNTER — Other Ambulatory Visit: Payer: Self-pay

## 2020-08-18 ENCOUNTER — Ambulatory Visit (INDEPENDENT_AMBULATORY_CARE_PROVIDER_SITE_OTHER): Payer: 59 | Admitting: Primary Care

## 2020-08-18 VITALS — BP 120/80 | HR 81 | Ht 65.25 in | Wt 144.0 lb

## 2020-08-18 DIAGNOSIS — Z23 Encounter for immunization: Secondary | ICD-10-CM | POA: Diagnosis not present

## 2020-08-18 DIAGNOSIS — Z1231 Encounter for screening mammogram for malignant neoplasm of breast: Secondary | ICD-10-CM

## 2020-08-18 DIAGNOSIS — E119 Type 2 diabetes mellitus without complications: Secondary | ICD-10-CM

## 2020-08-18 DIAGNOSIS — E7800 Pure hypercholesterolemia, unspecified: Secondary | ICD-10-CM | POA: Diagnosis not present

## 2020-08-18 DIAGNOSIS — D72819 Decreased white blood cell count, unspecified: Secondary | ICD-10-CM | POA: Diagnosis not present

## 2020-08-18 DIAGNOSIS — Z1211 Encounter for screening for malignant neoplasm of colon: Secondary | ICD-10-CM

## 2020-08-18 LAB — CBC WITH DIFFERENTIAL/PLATELET
Basophils Absolute: 0 10*3/uL (ref 0.0–0.1)
Basophils Relative: 0.7 % (ref 0.0–3.0)
Eosinophils Absolute: 0.1 10*3/uL (ref 0.0–0.7)
Eosinophils Relative: 1.9 % (ref 0.0–5.0)
HCT: 39.2 % (ref 36.0–46.0)
Hemoglobin: 13.4 g/dL (ref 12.0–15.0)
Lymphocytes Relative: 25.9 % (ref 12.0–46.0)
Lymphs Abs: 1.2 10*3/uL (ref 0.7–4.0)
MCHC: 34.1 g/dL (ref 30.0–36.0)
MCV: 89.5 fl (ref 78.0–100.0)
Monocytes Absolute: 0.5 10*3/uL (ref 0.1–1.0)
Monocytes Relative: 11.4 % (ref 3.0–12.0)
Neutro Abs: 2.8 10*3/uL (ref 1.4–7.7)
Neutrophils Relative %: 60.1 % (ref 43.0–77.0)
Platelets: 266 10*3/uL (ref 150.0–400.0)
RBC: 4.38 Mil/uL (ref 3.87–5.11)
RDW: 12.8 % (ref 11.5–15.5)
WBC: 4.7 10*3/uL (ref 4.0–10.5)

## 2020-08-18 LAB — COMPREHENSIVE METABOLIC PANEL
ALT: 31 U/L (ref 0–35)
AST: 25 U/L (ref 0–37)
Albumin: 4.4 g/dL (ref 3.5–5.2)
Alkaline Phosphatase: 80 U/L (ref 39–117)
BUN: 19 mg/dL (ref 6–23)
CO2: 29 mEq/L (ref 19–32)
Calcium: 9.4 mg/dL (ref 8.4–10.5)
Chloride: 102 mEq/L (ref 96–112)
Creatinine, Ser: 0.59 mg/dL (ref 0.40–1.20)
GFR: 103.67 mL/min (ref >60.00)
Glucose, Bld: 145 mg/dL — ABNORMAL HIGH (ref 70–99)
Potassium: 4.1 mEq/L (ref 3.5–5.1)
Sodium: 137 mEq/L (ref 135–145)
Total Bilirubin: 0.5 mg/dL (ref 0.2–1.2)
Total Protein: 6.7 g/dL (ref 6.0–8.3)

## 2020-08-18 LAB — POCT GLYCOSYLATED HEMOGLOBIN (HGB A1C): Hemoglobin A1C: 6.4 % — AB (ref 4.0–5.6)

## 2020-08-18 LAB — LIPID PANEL
Cholesterol: 165 mg/dL (ref 0–200)
HDL: 51.2 mg/dL (ref >39.00)
LDL Cholesterol: 92 mg/dL (ref 0–99)
NonHDL: 113.71
Total CHOL/HDL Ratio: 3
Triglycerides: 107 mg/dL (ref 0.0–149.0)
VLDL: 21.4 mg/dL (ref 0.0–40.0)

## 2020-08-18 NOTE — Progress Notes (Signed)
Subjective:    Patient ID: Leah Blankenship, female    DOB: [birth year], 59 y.o.   MRN: [id number]  HPI  This visit occurred during the SARS-CoV-2 public health emergency.  Safety protocols were in place, including screening questions prior to the visit, additional usage of staff PPE, and extensive cleaning of exam room while observing appropriate contact time as indicated for disinfecting solutions.   Ms. Leah Blankenship is a 59 year old female who presents today for diabetes follow up. She is due for colonoscopy and mammogram.   Current medications include: Lantus 10 units HS, metformin XR 750 mg  Last A1C: 11.9 in May 2021 Last Eye Exam: Due in October.  Last Foot Exam: UTD Pneumonia Vaccination: Due ACE/ARB: None. Urine micro negative in May 2021 Statin: Crestor 20 mg, repeat lipids due  BP Readings from Last 3 Encounters:  08/18/20 120/80  06/03/20 120/84  04/22/20 126/82   She is checking her blood glucose three times daily:  AM fasting: low to high 100's  Before lunch: low to mid 100's Before dinner: low to mid 100's  She has a brother that also has leukopenia, saw hematology for three years who determined it wasn't cancerous.    Review of Systems  Eyes: Negative for visual disturbance.  Respiratory: Negative for shortness of breath.   Cardiovascular: Negative for chest pain.  Neurological: Negative for dizziness and headaches.       Past Medical History:  Diagnosis Date   Gestational diabetes    Hyperlipidemia      Social History   Socioeconomic History   Marital status: Married    Spouse name: Not on file   Number of children: Not on file   Years of education: Not on file   Highest education level: Not on file  Occupational History   Not on file  Tobacco Use   Smoking status: Never Smoker   Smokeless tobacco: Never Used  Vaping Use   Vaping Use: Never used  Substance and Sexual Activity   Alcohol use: Yes    Alcohol/week: 1.0 standard drink     Types: 1 Standard drinks or equivalent per week   Drug use: No   Sexual activity: Not Currently    Comment: separated  Other Topics Concern   Not on file  Social History Narrative   Single.   Highest level of education 12th grade.   [employer], was previous truck Geophysicist/field seismologist.   1 daughter   Enjoys spending time with her daughter, resting.   Social Determinants of Health   Financial Resource Strain:    Difficulty of Paying Living Expenses: Not on file  Food Insecurity:    Worried About Charity fundraiser in the Last Year: Not on file   [employer] of Food in the Last Year: Not on file  Transportation Needs:    Lack of Transportation (Medical): Not on file   Lack of Transportation (Non-Medical): Not on file  Physical Activity:    Days of Exercise per Week: Not on file   Minutes of Exercise per Session: Not on file  Stress:    Feeling of Stress : Not on file  Social Connections:    Frequency of Communication with Friends and Family: Not on file   Frequency of Social Gatherings with Friends and Family: Not on file   Attends Religious Services: Not on file   Active Member of Clubs or Organizations: Not on file   Attends Archivist Meetings: Not on file  Marital Status: Not on file  Intimate Partner Violence:    Fear of Current or Ex-Partner: Not on file   Emotionally Abused: Not on file   Physically Abused: Not on file   Sexually Abused: Not on file    Past Surgical History:  Procedure Laterality Date   FRACTURE SURGERY  1985   left leg    Family History  Problem Relation Age of Onset   Heart disease Father        MI and stent placement   Arthritis Father    Hyperlipidemia Father    Hypertension Father    Cancer Sister        cervical   Cancer Brother        colon   Colon polyps Brother    Hyperlipidemia Mother    Pancreatic cancer Maternal Grandmother     No Known Allergies  Current Outpatient Medications on File  Prior to Visit  Medication Sig Dispense Refill   blood glucose meter kit and supplies Dispense based on patient and insurance preference. Use up to four times daily as directed. (FOR ICD-10 E10.9, E11.9). 1 each 0   glucose blood (ONETOUCH VERIO) test strip Use as instructed to test blood sugar up to 4 times a daily 100 each 5   insulin glargine (LANTUS SOLOSTAR) 100 UNIT/ML Solostar Pen Inject 10 Units into the skin at bedtime. For diabetes. 5 pen 1   Insulin Pen Needle (B-D UF III MINI PEN NEEDLES) 31G X 5 MM MISC Use nightly with insulin 100 each 5   Lancets (ONETOUCH DELICA PLUS [id number]) MISC USE AS DIRECTED UP TO 4 TIMES A DAY 100 each 5   metFORMIN (GLUCOPHAGE XR) 750 MG 24 hr tablet Take 1 tablet (750 mg total) by mouth daily with breakfast. For diabetes. 90 tablet 3   rosuvastatin (CRESTOR) 20 MG tablet Take 1 tablet (20 mg total) by mouth every evening. For cholesterol 90 tablet 3   No current facility-administered medications on file prior to visit.    BP 120/80    Pulse 81    Ht 5' 5.25" (1.657 m)    Wt 144 lb (65.3 kg)    SpO2 98%    BMI 23.78 kg/m    Objective:   Physical Exam Cardiovascular:     Rate and Rhythm: Normal rate and regular rhythm.  Pulmonary:     Effort: Pulmonary effort is normal.     Breath sounds: Normal breath sounds.  Musculoskeletal:     Cervical back: Neck supple.  Skin:    General: Skin is warm and dry.  Psychiatric:        Mood and Affect: Mood normal.            Assessment & Plan:

## 2020-08-18 NOTE — Addendum Note (Signed)
Addended by: Judd Gaudier on: 08/18/2020 08:18 AM   Modules accepted: Orders

## 2020-08-18 NOTE — Patient Instructions (Addendum)
Stop by the lab prior to leaving today. I will notify you of your results once received.   Continue to monitor your blood sugars, notify me if you see readings dipping below 80 on a consistent basis.   Call the breast center to schedule your mammogram.  You will be contacted regarding your referral to GI for the colonoscopy.  Please let us know if you have not been contacted within two weeks.   Please schedule a follow up appointment in 3 months for a complete physical.  It was a pleasure to see you today!  Pneumococcal Polysaccharide Vaccine (PPSV23): What You Need to Know 1. Why get vaccinated? Pneumococcal polysaccharide vaccine (PPSV23) can prevent pneumococcal disease. Pneumococcal disease refers to any illness caused by pneumococcal bacteria. These bacteria can cause many types of illnesses, including pneumonia, which is an infection of the lungs. Pneumococcal bacteria are one of the most common causes of pneumonia. Besides pneumonia, pneumococcal bacteria can also cause:  Ear infections  Sinus infections  Meningitis (infection of the tissue covering the brain and spinal cord)  Bacteremia (bloodstream infection) Anyone can get pneumococcal disease, but children under 58 years of age, people with certain medical conditions, adults 65 years or older, and cigarette smokers are at the highest risk. Most pneumococcal infections are mild. However, some can result in long-term problems, such as brain damage or hearing loss. Meningitis, bacteremia, and pneumonia caused by pneumococcal disease can be fatal. 2. PPSV23 PPSV23 protects against 23 types of bacteria that cause pneumococcal disease. PPSV23 is recommended for:  All adults 65 years or older,  Anyone 2 years or older with certain medical conditions that can lead to an increased risk for pneumococcal disease. Most people need only one dose of PPSV23. A second dose of PPSV23, and another type of pneumococcal vaccine called PCV13,  are recommended for certain high-risk groups. Your health care provider can give you more information. People 65 years or older should get a dose of PPSV23 even if they have already gotten one or more doses of the vaccine before they turned 37. 3. Talk with your health care provider Tell your vaccine provider if the person getting the vaccine:  Has had an allergic reaction after a previous dose of PPSV23, or has any severe, life-threatening allergies. In some cases, your health care provider may decide to postpone PPSV23 vaccination to a future visit. People with minor illnesses, such as a cold, may be vaccinated. People who are moderately or severely ill should usually wait until they recover before getting PPSV23. Your health care provider can give you more information. 4. Risks of a vaccine reaction  Redness or pain where the shot is given, feeling tired, fever, or muscle aches can happen after PPSV23. People sometimes faint after medical procedures, including vaccination. Tell your provider if you feel dizzy or have vision changes or ringing in the ears. As with any medicine, there is a very remote chance of a vaccine causing a severe allergic reaction, other serious injury, or death. 5. What if there is a serious problem? An allergic reaction could occur after the vaccinated person leaves the clinic. If you see signs of a severe allergic reaction (hives, swelling of the face and throat, difficulty breathing, a fast heartbeat, dizziness, or weakness), call 9-1-1 and get the person to the nearest hospital. For other signs that concern you, call your health care provider. Adverse reactions should be reported to the Vaccine Adverse Event Reporting System (VAERS). Your health care provider will  usually file this report, or you can do it yourself. Visit the VAERS website at www.vaers.LAgents.no or call (915)504-0523. VAERS is only for reporting reactions, and VAERS staff do not give medical advice. 6.  How can I learn more?  Ask your health care provider.  Call your local or state health department.  Contact the Centers for Disease Control and Prevention (CDC): ? Call (248) 368-9578 (1-800-CDC-INFO) or ? Visit CDC's website at PicCapture.uy CDC Vaccine Information Statement PPSV23 Vaccine (10/17/2018) This information is not intended to replace advice given to you by your health care provider. Make sure you discuss any questions you have with your health care provider. Document Revised: 03/26/2019 Document Reviewed: 07/17/2018 Elsevier Patient Education  2020 [employer].

## 2020-08-18 NOTE — Assessment & Plan Note (Signed)
Significantly improved with A1C today of 6.4!! Commended her on this!  For now we will continue Lantus 10 units HS. Continue Metformin XL 750 mg.   Discontinue Lantus if she experiences hypoglycemia.  Follow up in 3 months, may discontinue Lantus at that point.

## 2020-08-18 NOTE — Assessment & Plan Note (Signed)
Noted on prior labs from May 2021, repeat CBC pending. She did note today that her brother has leukopenia and has been evaluated and cancerous causes ruled out.  Repeat CBC, add diff, pending.

## 2020-08-18 NOTE — Assessment & Plan Note (Signed)
Compliant to statin therapy, repeat lipids pending. °

## 2020-08-26 ENCOUNTER — Encounter: Payer: Self-pay | Admitting: *Deleted

## 2020-09-28 LAB — HM DIABETES EYE EXAM

## 2020-10-01 ENCOUNTER — Encounter: Payer: Self-pay | Admitting: Primary Care

## 2020-10-15 ENCOUNTER — Encounter: Payer: Self-pay | Admitting: Primary Care

## 2020-11-04 ENCOUNTER — Other Ambulatory Visit: Payer: Self-pay

## 2020-11-04 ENCOUNTER — Telehealth: Payer: Self-pay | Admitting: Primary Care

## 2020-11-04 DIAGNOSIS — E119 Type 2 diabetes mellitus without complications: Secondary | ICD-10-CM

## 2020-11-04 MED ORDER — METFORMIN HCL ER 750 MG PO TB24: ORAL_TABLET | ORAL | 0 refills | Status: DC

## 2020-11-04 NOTE — Telephone Encounter (Signed)
Pt called in she increased her metformin and needed to get a refill on it. She has 1 refill but the pharmacy will not fill unless they have a new script.   Please advise.  [facility]

## 2020-11-04 NOTE — Telephone Encounter (Signed)
Called patient verified she is taking Metformin 1 1/2 tab bid. I have called in refill she is aware.

## 2020-11-18 ENCOUNTER — Ambulatory Visit (INDEPENDENT_AMBULATORY_CARE_PROVIDER_SITE_OTHER): Payer: 59 | Admitting: Primary Care

## 2020-11-18 ENCOUNTER — Other Ambulatory Visit: Payer: Self-pay

## 2020-11-18 VITALS — BP 112/62 | HR 107 | Temp 96.8°F | Ht 65.25 in | Wt 147.0 lb

## 2020-11-18 DIAGNOSIS — D72819 Decreased white blood cell count, unspecified: Secondary | ICD-10-CM | POA: Diagnosis not present

## 2020-11-18 DIAGNOSIS — E119 Type 2 diabetes mellitus without complications: Secondary | ICD-10-CM | POA: Diagnosis not present

## 2020-11-18 DIAGNOSIS — Z23 Encounter for immunization: Secondary | ICD-10-CM

## 2020-11-18 LAB — POCT GLYCOSYLATED HEMOGLOBIN (HGB A1C): Hemoglobin A1C: 7.4 % — AB (ref 4.0–5.6)

## 2020-11-18 MED ORDER — METFORMIN HCL ER 750 MG PO TB24: 750.0000 mg | ORAL_TABLET | Freq: Every day | ORAL | 2 refills | Status: DC

## 2020-11-18 MED ORDER — FREESTYLE LIBRE 14 DAY READER DEVI: 0 refills | Status: AC

## 2020-11-18 MED ORDER — SITAGLIPTIN PHOSPHATE 50 MG PO TABS: 50.0000 mg | ORAL_TABLET | Freq: Every day | ORAL | 1 refills | Status: DC

## 2020-11-18 MED ORDER — FREESTYLE LIBRE 14 DAY SENSOR MISC: 1 refills | Status: DC

## 2020-11-18 NOTE — Progress Notes (Signed)
Subjective:    Patient ID: Leah Blankenship, female    DOB: 10-Jul-1961, 59 y.o.   MRN: [id number]  HPI  This visit occurred during the SARS-CoV-2 public health emergency.  Safety protocols were in place, including screening questions prior to the visit, additional usage of staff PPE, and extensive cleaning of exam room while observing appropriate contact time as indicated for disinfecting solutions.   Leah Blankenship is a 59 year old female with a history of type 2 diabetes, hyperlipidemia, leukopenia who presents today for follow up of diabetes.  Current medications include: Metformin XR 750 mg, Lantus 10 units daily. She is actually now on Lantus 6 units but has noticed glucose readings are higher. She would like to come off of Lantus. Her brother is on Janumet, she is wondering if she can try this. She has noticed memory problems since increasing her metformin dose last visit.   She is checking her blood glucose 2-3 times daily and is getting readings of mid 100's to high 100's.   Last A1C: 6.4 in August 2021, 7.4 today Last Eye Exam: UTD Last Foot Exam: UTD Pneumonia Vaccination: Completed in 2021 ACE/ARB: Urine microalbumin  Statin: Crestor   BP Readings from Last 3 Encounters:  11/18/20 112/62  08/18/20 120/80  06/03/20 120/84       Review of Systems  Eyes: Negative for visual disturbance.  Respiratory: Negative for shortness of breath.   Cardiovascular: Negative for chest pain.  Neurological: Negative for dizziness and headaches.       Past Medical History:  Diagnosis Date  . Gestational diabetes   . Hyperlipidemia      Social History   Socioeconomic History  . Marital status: Married    Spouse name: Not on file  . Number of children: Not on file  . Years of education: Not on file  . Highest education level: Not on file  Occupational History  . Not on file  Tobacco Use  . Smoking status: Never Smoker  . Smokeless tobacco: Never Used  Vaping Use  . Vaping  Use: Never used  Substance and Sexual Activity  . Alcohol use: Yes    Alcohol/week: 1.0 standard drink    Types: 1 Standard drinks or equivalent per week  . Drug use: No  . Sexual activity: Not Currently    Comment: separated  Other Topics Concern  . Not on file  Social History Narrative   Single.   Highest level of education 12th grade.   [employer], was previous truck Geophysicist/field seismologist.   1 daughter   Enjoys spending time with her daughter, resting.   Social Determinants of Health   Financial Resource Strain:   . Difficulty of Paying Living Expenses: Not on file  Food Insecurity:   . Worried About Charity fundraiser in the Last Year: Not on file  . Ran Out of Food in the Last Year: Not on file  Transportation Needs:   . Lack of Transportation (Medical): Not on file  . Lack of Transportation (Non-Medical): Not on file  Physical Activity:   . Days of Exercise per Week: Not on file  . Minutes of Exercise per Session: Not on file  Stress:   . Feeling of Stress : Not on file  Social Connections:   . Frequency of Communication with Friends and Family: Not on file  . Frequency of Social Gatherings with Friends and Family: Not on file  . Attends Religious Services: Not on file  . Active  Member of Clubs or Organizations: Not on file  . Attends Archivist Meetings: Not on file  . Marital Status: Not on file  Intimate Partner Violence:   . Fear of Current or Ex-Partner: Not on file  . Emotionally Abused: Not on file  . Physically Abused: Not on file  . Sexually Abused: Not on file    Past Surgical History:  Procedure Laterality Date  . FRACTURE SURGERY  1985   left leg    Family History  Problem Relation Age of Onset  . Heart disease Father        MI and stent placement  . Arthritis Father   . Hyperlipidemia Father   . Hypertension Father   . Cancer Sister        cervical  . Cancer Brother        colon  . Colon polyps Brother   . Hyperlipidemia Mother   .  Pancreatic cancer Maternal Grandmother     No Known Allergies  Current Outpatient Medications on File Prior to Visit  Medication Sig Dispense Refill  . blood glucose meter kit and supplies Dispense based on patient and insurance preference. Use up to four times daily as directed. (FOR ICD-10 E10.9, E11.9). 1 each 0  . glucose blood (ONETOUCH VERIO) test strip Use as instructed to test blood sugar up to 4 times a daily 100 each 5  . insulin glargine (LANTUS SOLOSTAR) 100 UNIT/ML Solostar Pen Inject 10 Units into the skin at bedtime. For diabetes. 5 pen 1  . Insulin Pen Needle (B-D UF III MINI PEN NEEDLES) 31G X 5 MM MISC Use nightly with insulin 100 each 5  . Lancets (ONETOUCH DELICA PLUS [id number]) MISC USE AS DIRECTED UP TO 4 TIMES A DAY 100 each 5  . metFORMIN (GLUCOPHAGE XR) 750 MG 24 hr tablet 1 1/2 tab twice a day with meals 270 tablet 0  . rosuvastatin (CRESTOR) 20 MG tablet Take 1 tablet (20 mg total) by mouth every evening. For cholesterol 90 tablet 3   No current facility-administered medications on file prior to visit.    BP 112/62   Pulse (!) 107   Temp (!) 96.8 F (36 C) (Temporal)   Ht 5' 5.25" (1.657 m)   Wt 147 lb (66.7 kg)   BMI 24.27 kg/m    Objective:   Physical Exam Cardiovascular:     Rate and Rhythm: Normal rate and regular rhythm.  Pulmonary:     Effort: Pulmonary effort is normal.     Breath sounds: Normal breath sounds.  Musculoskeletal:     Cervical back: Neck supple.  Skin:    General: Skin is warm and dry.            Assessment & Plan:

## 2020-11-18 NOTE — Patient Instructions (Addendum)
We've reduced your Metformin to one tablet once daily.  Start sitagliptin (Januvia) 50 mg once daily for diabetes.  Continue Lantus 6 units, wean down slowly if able. Blood sugars should run consistently below 150.   Please schedule a follow up appointment in 3 months for diabetes check.  It was a pleasure to see you today!     Health Maintenance Due  Topic Date Due   HIV Screening  Never done   TETANUS/TDAP  Never done   COLONOSCOPY  Never done   MAMMOGRAM  01/26/2011   PAP SMEAR-Modifier  04/27/2018   INFLUENZA VACCINE  Never done    No flowsheet data found.  Recommended follow up: No follow-ups on file.

## 2020-11-18 NOTE — Assessment & Plan Note (Signed)
Last CBC with normal WBC count.  Continue to monitor.

## 2020-11-18 NOTE — Assessment & Plan Note (Signed)
Increase in A1C to 7.4 today on current regimen.  Reduce Metformin XR 750 mg to once daily given memory changes. Add Januvia 50 mg once daily.  Continue Lantus 6 units. She will wean down if glucose readings are reducing accordingly.   Follow up in 3 months.

## 2020-11-27 ENCOUNTER — Other Ambulatory Visit: Payer: Self-pay | Admitting: Primary Care

## 2020-12-17 ENCOUNTER — Other Ambulatory Visit: Payer: Self-pay | Admitting: Primary Care

## 2020-12-17 DIAGNOSIS — E119 Type 2 diabetes mellitus without complications: Secondary | ICD-10-CM

## 2020-12-17 NOTE — Telephone Encounter (Signed)
Pharmacy requests refill on: Lantus Solostar 100 unit/mL   LAST REFILL: 04/22/2020 LAST OV: 11/18/2020 NEXT OV: 03/10/2021 PHARMACY: [id number] [place], [place]  Hgb A1C (11/18/2020): 7.4

## 2020-12-25 NOTE — Telephone Encounter (Signed)
Per the patient: Hi, Leah Blankenship  I am taking 1 janu and 1 metformin together 2 x a day and stopped insulin 5 days ago but my sugar is all over the place as high as 295 at times.Should I continue as I'm doing or go back on insulin at 6 units like I was? 295 being the highest and 125 last night before supper and meds.  Please advise

## 2020-12-28 NOTE — Telephone Encounter (Signed)
This was already addressed on the same day. See other my chart message.

## 2021-01-20 ENCOUNTER — Telehealth: Payer: Self-pay | Admitting: Primary Care

## 2021-01-20 DIAGNOSIS — E119 Type 2 diabetes mellitus without complications: Secondary | ICD-10-CM

## 2021-01-20 NOTE — Telephone Encounter (Signed)
Patient called in stating that her pharmacy will not refill her prescription cause it was written incorrectly > Medication Januvia  Please advise. EM

## 2021-01-21 NOTE — Telephone Encounter (Signed)
Called and spoke with patient regarding this issue. Patient stated that since Mayra Reel, NP changed her to take Januvia twice daily, they will not refill the prescription because the current script they have states that this medication is daily. They are requesting a new order. Patient stated that her sugars are doing much better since this adjustment. Please advise.

## 2021-01-22 MED ORDER — SITAGLIPTIN PHOSPHATE 50 MG PO TABS: 50.0000 mg | ORAL_TABLET | Freq: Two times a day (BID) | ORAL | 2 refills | Status: DC

## 2021-01-22 MED ORDER — METFORMIN HCL ER 750 MG PO TB24: 750.0000 mg | ORAL_TABLET | Freq: Two times a day (BID) | ORAL | 2 refills | Status: AC

## 2021-01-22 NOTE — Telephone Encounter (Signed)
Noted, both prescriptions changed to BID dosing. New Rx's sent to pharmacy.

## 2021-01-22 NOTE — Addendum Note (Signed)
Addended by: Doreene Nest on: 01/22/2021 12:24 PM   Modules accepted: Orders

## 2021-01-22 NOTE — Telephone Encounter (Signed)
Please clarify, is she taking the Januvia and Metformin twice daily, or just the Januvia twice daily?

## 2021-01-22 NOTE — Telephone Encounter (Signed)
She verified that she takes both medications twice a day.

## 2021-01-24 ENCOUNTER — Other Ambulatory Visit: Payer: Self-pay | Admitting: Primary Care

## 2021-01-24 DIAGNOSIS — E119 Type 2 diabetes mellitus without complications: Secondary | ICD-10-CM

## 2021-01-24 NOTE — Telephone Encounter (Signed)
Insurance will not cover Januvia 50 mg BID, changed to Januvia 100 mg per pharmacy request.

## 2021-03-10 ENCOUNTER — Ambulatory Visit (INDEPENDENT_AMBULATORY_CARE_PROVIDER_SITE_OTHER): Payer: 59 | Admitting: Primary Care

## 2021-03-10 ENCOUNTER — Other Ambulatory Visit: Payer: Self-pay

## 2021-03-10 VITALS — BP 124/62 | HR 76 | Temp 98.6°F | Ht 65.25 in | Wt 145.0 lb

## 2021-03-10 DIAGNOSIS — E119 Type 2 diabetes mellitus without complications: Secondary | ICD-10-CM | POA: Diagnosis not present

## 2021-03-10 LAB — POCT GLYCOSYLATED HEMOGLOBIN (HGB A1C): Hemoglobin A1C: 7.5 % — AB (ref 4.0–5.6)

## 2021-03-10 MED ORDER — FREESTYLE LIBRE 14 DAY SENSOR MISC
1 refills | Status: DC
Start: 2021-03-10 — End: 2021-06-04

## 2021-03-10 NOTE — Assessment & Plan Note (Signed)
Stable with A1C of 7.5 today. Has been without Lantus for four months.  Continue Metformin XR 750 mg BID. Continue Januvia 50 mg BID, insurance will only cover 100 mg daily.   Eye and foot exam UTD. Managed on statin. Urine micro UTD.  Follow up in 6 months.

## 2021-03-10 NOTE — Patient Instructions (Signed)
Continue taking Metformin XR 750 mg, take 1 tablet by mouth twice daily.  Continue taking Januvia 100 mg, take 1/2 tablet twice daily.  Start exercising. You should be getting 150 minutes of moderate intensity exercise weekly.  It is important that you improve your diet. Please limit carbohydrates in the form of white bread, rice, pasta, sweets, fast food, fried food, sugary drinks, etc. Increase your consumption of fresh fruits and vegetables, whole grains, lean protein.  Ensure you are consuming 64 ounces of water daily.  Please schedule a follow up appointment in 4-6 months for your annual physical.   It was a pleasure to see you today!

## 2021-03-10 NOTE — Progress Notes (Signed)
Subjective:    Patient ID: Leah Blankenship, female    DOB: 09-Feb-1961, 60 y.o.   MRN: [id number]  HPI  Leah Blankenship is a very pleasant 60 y.o. female with a history of type 2 diabetes, who presents today for follow up of diabetes.  Current medications include: Januvia 50 mg BID, metformin XR 750 mg BID. We discontinued her Lantus four months ago, patient preference.    She is checking her blood glucose numerous times daily and is getting readings ranging mid 100's.   She's been under a lot of stress as she's the sole caregiver for her mother who is showing signs of Alzheimer's Disease.   Last A1C: 7.4 in December 2021, 7.5 today Last Eye Exam: UTD Last Foot Exam: UTD Pneumonia Vaccination: 2021 ACE/ARB: None. Urine microalbumin negative in May 2021 Statin: Crestor   BP Readings from Last 3 Encounters:  03/10/21 124/62  11/18/20 112/62  08/18/20 120/80        Review of Systems  Eyes: Negative for visual disturbance.  Respiratory: Negative for shortness of breath.   Cardiovascular: Negative for chest pain.  Neurological: Negative for dizziness and headaches.         Past Medical History:  Diagnosis Date  . Gestational diabetes   . Hyperlipidemia     Social History   Socioeconomic History  . Marital status: Married    Spouse name: Not on file  . Number of children: Not on file  . Years of education: Not on file  . Highest education level: Not on file  Occupational History  . Not on file  Tobacco Use  . Smoking status: Never Smoker  . Smokeless tobacco: Never Used  Vaping Use  . Vaping Use: Never used  Substance and Sexual Activity  . Alcohol use: Yes    Alcohol/week: 1.0 standard drink    Types: 1 Standard drinks or equivalent per week  . Drug use: No  . Sexual activity: Not Currently    Comment: separated  Other Topics Concern  . Not on file  Social History Narrative   Single.   Highest level of education 12th grade.   [employer], was  previous truck Geophysicist/field seismologist.   1 daughter   Enjoys spending time with her daughter, resting.   Social Determinants of Health   Financial Resource Strain: Not on file  Food Insecurity: Not on file  Transportation Needs: Not on file  Physical Activity: Not on file  Stress: Not on file  Social Connections: Not on file  Intimate Partner Violence: Not on file    Past Surgical History:  Procedure Laterality Date  . FRACTURE SURGERY  1985   left leg    Family History  Problem Relation Age of Onset  . Heart disease Father        MI and stent placement  . Arthritis Father   . Hyperlipidemia Father   . Hypertension Father   . Cancer Sister        cervical  . Cancer Brother        colon  . Colon polyps Brother   . Hyperlipidemia Mother   . Pancreatic cancer Maternal Grandmother     No Known Allergies  Current Outpatient Medications on File Prior to Visit  Medication Sig Dispense Refill  . blood glucose meter kit and supplies Dispense based on patient and insurance preference. Use up to four times daily as directed. (FOR ICD-10 E10.9, E11.9). 1 each 0  . Continuous Blood Gluc  Receiver (FREESTYLE LIBRE 14 DAY READER) DEVI Use with sensor to check blood sugar. 1 each 0  . Continuous Blood Gluc Sensor (FREESTYLE LIBRE 14 DAY SENSOR) MISC Use as directed to check blood sugars. 6 each 1  . Insulin Pen Needle (B-D UF III MINI PEN NEEDLES) 31G X 5 MM MISC Use nightly with insulin 100 each 5  . Lancets (ONETOUCH DELICA PLUS [id number]) MISC USE AS DIRECTED UP TO 4 TIMES A DAY 100 each 5  . LANTUS SOLOSTAR 100 UNIT/ML Solostar Pen INJECT 10 UNITS INTO THE SKIN AT BEDTIME. FOR DIABETES. 15 mL 1  . metFORMIN (GLUCOPHAGE XR) 750 MG 24 hr tablet Take 1 tablet (750 mg total) by mouth 2 (two) times daily with a meal. For diabetes. 180 tablet 2  . ONETOUCH VERIO test strip USE AS INSTRUCTED TO TEST BLOOD SUGAR UP TO 4 TIMES A DAILY 100 strip 5  . rosuvastatin (CRESTOR) 20 MG tablet Take 1 tablet (20 mg  total) by mouth every evening. For cholesterol 90 tablet 3  . sitaGLIPtin (JANUVIA) 100 MG tablet Take 1 tablet (100 mg total) by mouth daily. For diabetes. 90 tablet 3   No current facility-administered medications on file prior to visit.    BP 124/62   Pulse 76   Temp 98.6 F (37 C) (Temporal)   Ht 5' 5.25" (1.657 m)   Wt 145 lb (65.8 kg)   SpO2 95%   BMI 23.94 kg/m  Objective:   Physical Exam Cardiovascular:     Rate and Rhythm: Normal rate and regular rhythm.  Pulmonary:     Effort: Pulmonary effort is normal.     Breath sounds: Normal breath sounds.  Musculoskeletal:     Cervical back: Neck supple.  Skin:    General: Skin is warm and dry.           Assessment & Plan:      This visit occurred during the SARS-CoV-2 public health emergency.  Safety protocols were in place, including screening questions prior to the visit, additional usage of staff PPE, and extensive cleaning of exam room while observing appropriate contact time as indicated for disinfecting solutions.

## 2021-03-26 ENCOUNTER — Other Ambulatory Visit: Payer: Self-pay | Admitting: Primary Care

## 2021-03-26 DIAGNOSIS — E7800 Pure hypercholesterolemia, unspecified: Secondary | ICD-10-CM

## 2021-03-26 NOTE — Telephone Encounter (Signed)
Leah Blankenship called in due to [place] refilled all of her medication execpt the Rosuvstatin.  [facility]- [facility] [address]

## 2021-05-04 ENCOUNTER — Telehealth: Payer: Self-pay

## 2021-05-04 DIAGNOSIS — E119 Type 2 diabetes mellitus without complications: Secondary | ICD-10-CM

## 2021-05-04 NOTE — Addendum Note (Signed)
Addended by: Doreene Nest on: 05/04/2021 04:39 PM   Modules accepted: Orders

## 2021-05-04 NOTE — Telephone Encounter (Signed)
Requesting for referral for Endocrinology Thomas L. Gershon Crane, MD [facility]

## 2021-05-04 NOTE — Telephone Encounter (Signed)
Noted, referral placed.  

## 2021-05-19 ENCOUNTER — Encounter: Payer: Self-pay | Admitting: Internal Medicine

## 2021-05-19 ENCOUNTER — Other Ambulatory Visit: Payer: Self-pay

## 2021-05-19 ENCOUNTER — Ambulatory Visit (INDEPENDENT_AMBULATORY_CARE_PROVIDER_SITE_OTHER): Payer: 59 | Admitting: Internal Medicine

## 2021-05-19 VITALS — BP 122/78 | HR 93 | Temp 97.0°F | Ht 65.25 in | Wt 146.0 lb

## 2021-05-19 DIAGNOSIS — E7800 Pure hypercholesterolemia, unspecified: Secondary | ICD-10-CM

## 2021-05-19 DIAGNOSIS — R5383 Other fatigue: Secondary | ICD-10-CM | POA: Diagnosis not present

## 2021-05-19 DIAGNOSIS — R5382 Chronic fatigue, unspecified: Secondary | ICD-10-CM | POA: Insufficient documentation

## 2021-05-19 DIAGNOSIS — R300 Dysuria: Secondary | ICD-10-CM

## 2021-05-19 DIAGNOSIS — N3000 Acute cystitis without hematuria: Secondary | ICD-10-CM | POA: Insufficient documentation

## 2021-05-19 LAB — T4, FREE: Free T4: 0.89 ng/dL (ref 0.60–1.60)

## 2021-05-19 LAB — COMPREHENSIVE METABOLIC PANEL
ALT: 22 U/L (ref 0–35)
AST: 17 U/L (ref 0–37)
Albumin: 4.3 g/dL (ref 3.5–5.2)
Alkaline Phosphatase: 64 U/L (ref 39–117)
BUN: 15 mg/dL (ref 6–23)
CO2: 27 mEq/L (ref 19–32)
Calcium: 9.4 mg/dL (ref 8.4–10.5)
Chloride: 102 mEq/L (ref 96–112)
Creatinine, Ser: 0.67 mg/dL (ref 0.40–1.20)
GFR: 94.2 mL/min (ref >60.00)
Glucose, Bld: 143 mg/dL — ABNORMAL HIGH (ref 70–99)
Potassium: 4 mEq/L (ref 3.5–5.1)
Sodium: 138 mEq/L (ref 135–145)
Total Bilirubin: 0.5 mg/dL (ref 0.2–1.2)
Total Protein: 6.4 g/dL (ref 6.0–8.3)

## 2021-05-19 LAB — POC URINALSYSI DIPSTICK (AUTOMATED)
Bilirubin, UA: NEGATIVE
Blood, UA: NEGATIVE
Glucose, UA: NEGATIVE
Ketones, UA: NEGATIVE
Nitrite, UA: NEGATIVE
Protein, UA: NEGATIVE
Spec Grav, UA: 1.02 (ref 1.010–1.025)
Urobilinogen, UA: 0.2 E.U./dL
pH, UA: 6 (ref 5.0–8.0)

## 2021-05-19 LAB — CBC
HCT: 40 % (ref 36.0–46.0)
Hemoglobin: 13.7 g/dL (ref 12.0–15.0)
MCHC: 34.2 g/dL (ref 30.0–36.0)
MCV: 87.4 fl (ref 78.0–100.0)
Platelets: 264 10*3/uL (ref 150.0–400.0)
RBC: 4.58 Mil/uL (ref 3.87–5.11)
RDW: 12.4 % (ref 11.5–15.5)
WBC: 4.9 10*3/uL (ref 4.0–10.5)

## 2021-05-19 LAB — LIPID PANEL
Cholesterol: 172 mg/dL (ref 0–200)
HDL: 44.3 mg/dL (ref >39.00)
NonHDL: 127.39
Total CHOL/HDL Ratio: 4
Triglycerides: 229 mg/dL — ABNORMAL HIGH (ref 0.0–149.0)
VLDL: 45.8 mg/dL — ABNORMAL HIGH (ref 0.0–40.0)

## 2021-05-19 MED ORDER — AMOXICILLIN 500 MG PO TABS: 1000.0000 mg | ORAL_TABLET | Freq: Two times a day (BID) | ORAL | 0 refills | Status: AC

## 2021-05-19 NOTE — Assessment & Plan Note (Signed)
Very non specific No findings to suggest early disseminated Lyme disease Will check labs Discussed that empiric Rx for Lyme not really indicated--but has cystitis symptoms (so will extend the amoxil for this)

## 2021-05-19 NOTE — Assessment & Plan Note (Signed)
Intermittent dysuria for a while 1+ leuks Will treat empirically with amoxil

## 2021-05-19 NOTE — Progress Notes (Signed)
Subjective:    Patient ID: Leah Blankenship, female    DOB: July 07, 1961, 60 y.o.   MRN: [id number]  HPI Here due to illness following tick bite This visit occurred during the SARS-CoV-2 public health emergency.  Safety protocols were in place, including screening questions prior to the visit, additional usage of staff PPE, and extensive cleaning of exam room while observing appropriate contact time as indicated for disinfecting solutions.   Feels tired---for 4 weeks or so Worries about Lyme disease ---neighbors have it Did remove an engorged tick about 4 weeks ago No bullseye rash  Some joint pain---"aches and pains all over" Plus the fatigue No chest pain or SOB No dizziness or syncope  Current Outpatient Medications on File Prior to Visit  Medication Sig Dispense Refill  . Continuous Blood Gluc Receiver (FREESTYLE LIBRE 14 DAY READER) DEVI Use with sensor to check blood sugar. 1 each 0  . Continuous Blood Gluc Sensor (FREESTYLE LIBRE 14 DAY SENSOR) MISC Use as directed to check blood sugars. 6 each 1  . Insulin Pen Needle (B-D UF III MINI PEN NEEDLES) 31G X 5 MM MISC Use nightly with insulin 100 each 5  . Lancets (ONETOUCH DELICA PLUS [id number]) MISC USE AS DIRECTED UP TO 4 TIMES A DAY 100 each 5  . metFORMIN (GLUCOPHAGE XR) 750 MG 24 hr tablet Take 1 tablet (750 mg total) by mouth 2 (two) times daily with a meal. For diabetes. 180 tablet 2  . ONETOUCH VERIO test strip USE AS INSTRUCTED TO TEST BLOOD SUGAR UP TO 4 TIMES A DAILY 100 strip 5  . rosuvastatin (CRESTOR) 20 MG tablet TAKE 1 TABLET (20 MG TOTAL) BY MOUTH EVERY EVENING. FOR CHOLESTEROL 90 tablet 3  . sitaGLIPtin (JANUVIA) 100 MG tablet Take 1 tablet (100 mg total) by mouth daily. For diabetes. 90 tablet 3  . blood glucose meter kit and supplies Dispense based on patient and insurance preference. Use up to four times daily as directed. (FOR ICD-10 E10.9, E11.9). (Patient not taking: Reported on 05/19/2021) 1 each 0  . LANTUS  SOLOSTAR 100 UNIT/ML Solostar Pen INJECT 10 UNITS INTO THE SKIN AT BEDTIME. FOR DIABETES. (Patient not taking: Reported on 05/19/2021) 15 mL 1   No current facility-administered medications on file prior to visit.    No Known Allergies  Past Medical History:  Diagnosis Date  . Gestational diabetes   . Hyperlipidemia     Past Surgical History:  Procedure Laterality Date  . FRACTURE SURGERY  1985   left leg    Family History  Problem Relation Age of Onset  . Heart disease Father        MI and stent placement  . Arthritis Father   . Hyperlipidemia Father   . Hypertension Father   . Cancer Sister        cervical  . Cancer Brother        colon  . Colon polyps Brother   . Hyperlipidemia Mother   . Pancreatic cancer Maternal Grandmother     Social History   Socioeconomic History  . Marital status: Married    Spouse name: Not on file  . Number of children: Not on file  . Years of education: Not on file  . Highest education level: Not on file  Occupational History  . Not on file  Tobacco Use  . Smoking status: Never Smoker  . Smokeless tobacco: Never Used  Vaping Use  . Vaping Use: Never used  Substance and Sexual  Activity  . Alcohol use: Yes    Alcohol/week: 1.0 standard drink    Types: 1 Standard drinks or equivalent per week  . Drug use: No  . Sexual activity: Not Currently    Comment: separated  Other Topics Concern  . Not on file  Social History Narrative   Single.   Highest level of education 12th grade.   [employer], was previous truck Geophysicist/field seismologist.   1 daughter   Enjoys spending time with her daughter, resting.   Social Determinants of Health   Financial Resource Strain: Not on file  Food Insecurity: Not on file  Transportation Needs: Not on file  Physical Activity: Not on file  Stress: Not on file  Social Connections: Not on file  Intimate Partner Violence: Not on file   Review of Systems  No rash Off and on --some burning dysuria No known  COVID exposure---did have it about 3 months ago No fever No hematuria No abdominal pain---slightly tender suprapubic for a while     Objective:   Physical Exam Constitutional:      Appearance: Normal appearance.  Cardiovascular:     Rate and Rhythm: Normal rate and regular rhythm.     Heart sounds: No murmur heard. No gallop.   Pulmonary:     Effort: Pulmonary effort is normal.     Breath sounds: Normal breath sounds. No wheezing or rales.  Abdominal:     Palpations: Abdomen is soft.     Comments: Slight suprapubic sensitivity  Musculoskeletal:     Right lower leg: No edema.     Left lower leg: No edema.     Comments: No joint swelling  Skin:    Findings: No rash.  Neurological:     Mental Status: She is alert.            Assessment & Plan:

## 2021-05-19 NOTE — Assessment & Plan Note (Signed)
Continues on statin Will check levels since doing labs anyway

## 2021-05-20 LAB — LDL CHOLESTEROL, DIRECT: Direct LDL: 100 mg/dL

## 2021-05-24 NOTE — Telephone Encounter (Signed)
Both [name] and Dr Alphonsus Sias are out of the office today. [name], does [name] look at her messages on Mondays. She is taking the amoxicillin but it is not working. Urine was not cultured.  I advised if she needed Azo for pain, go ahead and get some.

## 2021-05-25 ENCOUNTER — Other Ambulatory Visit: Payer: Self-pay

## 2021-05-25 DIAGNOSIS — R300 Dysuria: Secondary | ICD-10-CM

## 2021-05-25 NOTE — Telephone Encounter (Signed)
Left message to return call to our office.  

## 2021-05-25 NOTE — Telephone Encounter (Signed)
Please call patient. She will need to be seen for an actual UA with culture for this issue, especially if no better.

## 2021-05-25 NOTE — Telephone Encounter (Signed)
Patient returned your call. EM 

## 2021-05-25 NOTE — Telephone Encounter (Signed)
Called patient per [name] ok to bring urine to have u/a and culture. I have added on lab schedule.

## 2021-05-26 ENCOUNTER — Other Ambulatory Visit: Payer: Self-pay

## 2021-05-26 ENCOUNTER — Other Ambulatory Visit (INDEPENDENT_AMBULATORY_CARE_PROVIDER_SITE_OTHER): Payer: 59

## 2021-05-26 DIAGNOSIS — R300 Dysuria: Secondary | ICD-10-CM

## 2021-05-27 ENCOUNTER — Telehealth: Payer: Self-pay | Admitting: Primary Care

## 2021-05-27 NOTE — Telephone Encounter (Signed)
New message   Patient calling for test results - urine sample drop off on yesterday

## 2021-05-27 NOTE — Telephone Encounter (Signed)
POC urine says "active". Where are the results?

## 2021-05-27 NOTE — Telephone Encounter (Signed)
Please advise 

## 2021-05-28 LAB — URINE CULTURE
MICRO NUMBER:: 11983802
SPECIMEN QUALITY:: ADEQUATE

## 2021-05-28 NOTE — Telephone Encounter (Signed)
I need to see her so we can get to the bottom of her symptoms. Can we get her scheduled for next week?

## 2021-05-28 NOTE — Telephone Encounter (Signed)
Patient called back in regards to her urine culture. Patient states her symptoms are the same. Patient is still taking daily Azo and that helps with pain and burning with urination but pressure of the bladder is still present when her bladder feels up. No new symptoms. Since urine culture is negative what should be the next step?  Patient states her sugar levels have been around 150-175 and sometimes higher, these readings are all through out the day, not just fasting.

## 2021-05-31 NOTE — Telephone Encounter (Signed)
Called patient appointment has been made for Friday that was the first one she could make it to.

## 2021-06-04 ENCOUNTER — Encounter: Payer: Self-pay | Admitting: Primary Care

## 2021-06-04 ENCOUNTER — Ambulatory Visit (INDEPENDENT_AMBULATORY_CARE_PROVIDER_SITE_OTHER): Payer: 59 | Admitting: Primary Care

## 2021-06-04 ENCOUNTER — Other Ambulatory Visit: Payer: Self-pay

## 2021-06-04 VITALS — BP 134/84 | HR 88 | Temp 97.6°F | Ht 65.25 in | Wt 147.0 lb

## 2021-06-04 DIAGNOSIS — R300 Dysuria: Secondary | ICD-10-CM

## 2021-06-04 DIAGNOSIS — Z1231 Encounter for screening mammogram for malignant neoplasm of breast: Secondary | ICD-10-CM | POA: Diagnosis not present

## 2021-06-04 DIAGNOSIS — Z23 Encounter for immunization: Secondary | ICD-10-CM

## 2021-06-04 DIAGNOSIS — E119 Type 2 diabetes mellitus without complications: Secondary | ICD-10-CM

## 2021-06-04 LAB — POC URINALSYSI DIPSTICK (AUTOMATED)
Bilirubin, UA: NEGATIVE
Blood, UA: NEGATIVE
Glucose, UA: POSITIVE — AB
Nitrite, UA: POSITIVE
Protein, UA: POSITIVE — AB
Spec Grav, UA: 1.02 (ref 1.010–1.025)
Urobilinogen, UA: 0.2 E.U./dL
pH, UA: 5 (ref 5.0–8.0)

## 2021-06-04 LAB — POCT GLYCOSYLATED HEMOGLOBIN (HGB A1C): Hemoglobin A1C: 8.1 % — AB (ref 4.0–5.6)

## 2021-06-04 LAB — MICROALBUMIN / CREATININE URINE RATIO
Creatinine,U: 80 mg/dL
Microalb Creat Ratio: 1.9 mg/g (ref 0.0–30.0)
Microalb, Ur: 1.5 mg/dL (ref 0.0–1.9)

## 2021-06-04 MED ORDER — GLIPIZIDE ER 5 MG PO TB24: 5.0000 mg | ORAL_TABLET | Freq: Every day | ORAL | 1 refills | Status: DC

## 2021-06-04 MED ORDER — FREESTYLE LIBRE 14 DAY SENSOR MISC: 1 refills | Status: AC

## 2021-06-04 NOTE — Progress Notes (Signed)
Subjective:    Patient ID: Leah Blankenship, female    DOB: 1961/05/29, 60 y.o.   MRN: [id number]  HPI  Leah Blankenship is a very pleasant 60 y.o. female with a history of type 2 diabetes, cystitis, leukopenia who presents today to discuss dysuria. She is due for Shingrix and mammogram.   She was originally evaluated by Dr. Silvio Pate on 05/19/21 for symptoms of fatigue and joint pain, also intermittent dysuria. She was treated empirically with Amoxil given 1+ leuks in UA, no culture obtained.   She notified us one week later that she had no improvement in dysuria symptoms. Repeat UA with added culture completed which was negative.   Today she endorses dysuria. She's been taking AZO for the last three days which has helped with pain. She drinks 2 cups of coffee in the AM, and 1 diet soda daily. She denies hematuria, vaginal discharge, vaginal itching. She's never smoked.   Last A1C was 7.5 in March 2022. She checks her glucose regularly which ranges mid 100's to low 200's. She is not on SGL2 for diabetes. She is needing a refill of her FreeStyle Libre sensors.     Review of Systems  Cardiovascular:  Negative for chest pain.  Genitourinary:  Positive for dysuria. Negative for frequency, vaginal bleeding and vaginal discharge.        Past Medical History:  Diagnosis Date   Gestational diabetes    Hyperlipidemia     Social History   Socioeconomic History   Marital status: Married    Spouse name: Not on file   Number of children: Not on file   Years of education: Not on file   Highest education level: Not on file  Occupational History   Not on file  Tobacco Use   Smoking status: Never   Smokeless tobacco: Never  Vaping Use   Vaping Use: Never used  Substance and Sexual Activity   Alcohol use: Yes    Alcohol/week: 1.0 standard drink    Types: 1 Standard drinks or equivalent per week   Drug use: No   Sexual activity: Not Currently    Comment: separated  Other Topics Concern    Not on file  Social History Narrative   Single.   Highest level of education 12th grade.   [employer], was previous truck Geophysicist/field seismologist.   1 daughter   Enjoys spending time with her daughter, resting.   Social Determinants of Health   Financial Resource Strain: Not on file  Food Insecurity: Not on file  Transportation Needs: Not on file  Physical Activity: Not on file  Stress: Not on file  Social Connections: Not on file  Intimate Partner Violence: Not on file    Past Surgical History:  Procedure Laterality Date   FRACTURE SURGERY  1985   left leg    Family History  Problem Relation Age of Onset   Heart disease Father        MI and stent placement   Arthritis Father    Hyperlipidemia Father    Hypertension Father    Cancer Sister        cervical   Cancer Brother        colon   Colon polyps Brother    Hyperlipidemia Mother    Pancreatic cancer Maternal Grandmother     No Known Allergies  Current Outpatient Medications on File Prior to Visit  Medication Sig Dispense Refill   blood glucose meter kit and supplies Dispense based on  patient and insurance preference. Use up to four times daily as directed. (FOR ICD-10 E10.9, E11.9). 1 each 0   Continuous Blood Gluc Receiver (FREESTYLE LIBRE 14 DAY READER) DEVI Use with sensor to check blood sugar. 1 each 0   Continuous Blood Gluc Sensor (FREESTYLE LIBRE 14 DAY SENSOR) MISC Use as directed to check blood sugars. 6 each 1   Insulin Pen Needle (B-D UF III MINI PEN NEEDLES) 31G X 5 MM MISC Use nightly with insulin 100 each 5   Lancets (ONETOUCH DELICA PLUS [id number]) MISC USE AS DIRECTED UP TO 4 TIMES A DAY 100 each 5   metFORMIN (GLUCOPHAGE XR) 750 MG 24 hr tablet Take 1 tablet (750 mg total) by mouth 2 (two) times daily with a meal. For diabetes. 180 tablet 2   ONETOUCH VERIO test strip USE AS INSTRUCTED TO TEST BLOOD SUGAR UP TO 4 TIMES A DAILY 100 strip 5   rosuvastatin (CRESTOR) 20 MG tablet TAKE 1 TABLET (20 MG TOTAL) BY  MOUTH EVERY EVENING. FOR CHOLESTEROL 90 tablet 3   sitaGLIPtin (JANUVIA) 100 MG tablet Take 1 tablet (100 mg total) by mouth daily. For diabetes. 90 tablet 3   No current facility-administered medications on file prior to visit.    BP 134/84   Pulse 88   Temp 97.6 F (36.4 C) (Temporal)   Ht 5' 5.25" (1.657 m)   Wt 147 lb (66.7 kg)   SpO2 97%   BMI 24.27 kg/m  Objective:   Physical Exam Cardiovascular:     Rate and Rhythm: Normal rate and regular rhythm.  Pulmonary:     Effort: Pulmonary effort is normal.     Breath sounds: Normal breath sounds.  Abdominal:     Tenderness: There is abdominal tenderness in the suprapubic area.  Musculoskeletal:     Cervical back: Neck supple.  Skin:    General: Skin is warm and dry.          Assessment & Plan:      This visit occurred during the SARS-CoV-2 public health emergency.  Safety protocols were in place, including screening questions prior to the visit, additional usage of staff PPE, and extensive cleaning of exam room while observing appropriate contact time as indicated for disinfecting solutions.

## 2021-06-04 NOTE — Addendum Note (Signed)
Addended by: Donnamarie Poag on: 06/04/2021 11:53 AM   Modules accepted: Orders

## 2021-06-04 NOTE — Assessment & Plan Note (Signed)
3+ glucose in urine. A1C increased to 8.1 from 7.5 three months ago.  Add Glipizide XL 5 mg. Continue Metformin XR 750 mg BID. Continue Januvia 100 mg.   Follow up in 3 months.

## 2021-06-04 NOTE — Progress Notes (Signed)
[id number]

## 2021-06-04 NOTE — Assessment & Plan Note (Signed)
Acute for the last 3 weeks, temporary improvement only with AZO, Amoxil not effective. Recent urine culture negative.  UA today with trace leuks, positive nitrites, 3+ glucose. Culture pending.  If culture negative, suspect diabetes to be contributing. Will work to lower A1C. Discussed to stop caffeine.

## 2021-06-04 NOTE — Patient Instructions (Addendum)
Start glipizide XL 5 mg once daily with breakfast for diabetes.  Continue taking metformin and sitagliptan (Januvia) for diabetes.  Avoid caffeine if possible, this will cause irritation to the bladder.  Please schedule a follow up visit for 3 months.  It was a pleasure to see you today!

## 2021-06-07 ENCOUNTER — Emergency Department (HOSPITAL_COMMUNITY)
Admission: EM | Admit: 2021-06-07 | Discharge: 2021-06-08 | Disposition: A | Discharge status: Home / Self Care | Payer: 59 | Attending: Emergency Medicine | Admitting: Emergency Medicine

## 2021-06-07 ENCOUNTER — Encounter (HOSPITAL_COMMUNITY): Payer: Self-pay | Admitting: *Deleted

## 2021-06-07 ENCOUNTER — Other Ambulatory Visit: Payer: Self-pay

## 2021-06-07 DIAGNOSIS — N390 Urinary tract infection, site not specified: Secondary | ICD-10-CM | POA: Diagnosis not present

## 2021-06-07 DIAGNOSIS — Z7984 Long term (current) use of oral hypoglycemic drugs: Secondary | ICD-10-CM | POA: Diagnosis not present

## 2021-06-07 DIAGNOSIS — Z794 Long term (current) use of insulin: Secondary | ICD-10-CM | POA: Diagnosis not present

## 2021-06-07 DIAGNOSIS — E119 Type 2 diabetes mellitus without complications: Secondary | ICD-10-CM | POA: Diagnosis not present

## 2021-06-07 DIAGNOSIS — R39198 Other difficulties with micturition: Secondary | ICD-10-CM | POA: Diagnosis present

## 2021-06-07 LAB — URINALYSIS, ROUTINE W REFLEX MICROSCOPIC
Bilirubin Urine: NEGATIVE
Glucose, UA: >=500 mg/dL — AB
Hgb urine dipstick: NEGATIVE
Ketones, ur: NEGATIVE mg/dL
Leukocytes,Ua: NEGATIVE
Nitrite: NEGATIVE
Protein, ur: NEGATIVE mg/dL
Specific Gravity, Urine: 1.006 (ref 1.005–1.030)
pH: 6 (ref 5.0–8.0)

## 2021-06-07 LAB — URINE CULTURE
MICRO NUMBER:: 12021088
SPECIMEN QUALITY:: ADEQUATE

## 2021-06-07 NOTE — ED Triage Notes (Signed)
Pt has been dealing with what she thinks is a UTI for 4 weeks and was told that she has a bacterial infection that came in this am.  Pt states bladder feels like it is on fire.  Pt reports fatigue. No fever.  Pt states her Mychart showed Klebsiella Pneumoniae from the urine sample.  Pt is diabetic and blood sugars running 140-200.

## 2021-06-08 ENCOUNTER — Other Ambulatory Visit: Payer: Self-pay | Admitting: Primary Care

## 2021-06-08 DIAGNOSIS — N3000 Acute cystitis without hematuria: Secondary | ICD-10-CM

## 2021-06-08 LAB — CBC WITH DIFFERENTIAL/PLATELET
Abs Immature Granulocytes: 0.01 10*3/uL (ref 0.00–0.07)
Basophils Absolute: 0 10*3/uL (ref 0.0–0.1)
Basophils Relative: 1 %
Eosinophils Absolute: 0.1 10*3/uL (ref 0.0–0.5)
Eosinophils Relative: 2 %
HCT: 40.6 % (ref 36.0–46.0)
Hemoglobin: 13.8 g/dL (ref 12.0–15.0)
Immature Granulocytes: 0 %
Lymphocytes Relative: 28 %
Lymphs Abs: 1.1 10*3/uL (ref 0.7–4.0)
MCH: 30.1 pg (ref 26.0–34.0)
MCHC: 34 g/dL (ref 30.0–36.0)
MCV: 88.5 fL (ref 80.0–100.0)
Monocytes Absolute: 0.5 10*3/uL (ref 0.1–1.0)
Monocytes Relative: 13 %
Neutro Abs: 2.2 10*3/uL (ref 1.7–7.7)
Neutrophils Relative %: 56 %
Platelets: 245 10*3/uL (ref 150–400)
RBC: 4.59 MIL/uL (ref 3.87–5.11)
RDW: 12.4 % (ref 11.5–15.5)
WBC: 4 10*3/uL (ref 4.0–10.5)
nRBC: 0 % (ref 0.0–0.2)

## 2021-06-08 LAB — COMPREHENSIVE METABOLIC PANEL
ALT: 31 U/L (ref 0–44)
AST: 23 U/L (ref 15–41)
Albumin: 4.1 g/dL (ref 3.5–5.0)
Alkaline Phosphatase: 61 U/L (ref 38–126)
Anion gap: 15 (ref 5–15)
BUN: 13 mg/dL (ref 8–23)
CO2: 26 mmol/L (ref 22–32)
Calcium: 9.6 mg/dL (ref 8.9–10.3)
Chloride: 97 mmol/L — ABNORMAL LOW (ref 98–111)
Creatinine, Ser: 0.56 mg/dL (ref 0.44–1.00)
GFR, Estimated: >60 mL/min (ref >60)
Glucose, Bld: 233 mg/dL — ABNORMAL HIGH (ref 70–99)
Potassium: 3.8 mmol/L (ref 3.5–5.1)
Sodium: 138 mmol/L (ref 135–145)
Total Bilirubin: 0.9 mg/dL (ref 0.3–1.2)
Total Protein: 6.8 g/dL (ref 6.5–8.1)

## 2021-06-08 MED ORDER — SULFAMETHOXAZOLE-TRIMETHOPRIM 800-160 MG PO TABS: 1.0000 | ORAL_TABLET | Freq: Two times a day (BID) | ORAL | 0 refills | Status: DC

## 2021-06-08 NOTE — Discharge Instructions (Addendum)
Your laboratory results were within normal limits today.  Your kidney function was unremarkable. You were prescribed antibiotics by your primary care physician.  Please take those as prescribed.  You may also want to pick up your new medication to better control your blood glucose.  If you experience any fever, back pain or any symptoms please return to the emergency department.

## 2021-06-08 NOTE — ED Provider Notes (Signed)
[facility] EMER[facility]ENC[facility] DEPARTMENT Provider Note   CSN: [id number] Arrival date & time: 06/07/21  1225     History No chief complaint on file.   Leah Blankenship is a 60 y.o. female.  60 y.o female with a PMH of hyperlipidemia, diabetes currently on medication presents to the ED with a chief complaint of UTI symptoms for 1 month.  She was evaluated by PCP a couple days ago, urine did not get culture, reschedule an appointment PCP.  States that her MyChart showed Klebsiella pneumonia came from urine sample.  She states that she would like to make sure the antibiotic that she is taking is targeting the bacteria that she needs.  Also endorses blood sugars have been running higher than usual, although according to the last chart review she had an additional medication added to her regimen.  This medication has not been picked up.  She also endorses worsening fatigue, feels like she is overall rundown all the time.  Denies any back pain, fevers, or abdominal pain.  The history is provided by the patient and medical records.      Past Medical History:  Diagnosis Date   Gestational diabetes    Hyperlipidemia     Patient Active Problem List   Diagnosis Date Noted   Dysuria 06/04/2021   Fatigue 05/19/2021   Acute cystitis without hematuria 05/19/2021   Leukopenia 08/18/2020   Type 2 diabetes mellitus without complication, without long-term current use of insulin ([place]) 04/22/2020   Hypercholesteremia 05/05/2015    Past Surgical History:  Procedure Laterality Date   FRACTURE SURGERY  1985   left leg     OB History     Gravida  1   Para  1   Term  1   Preterm      AB      Living  1      SAB      IAB      Ectopic      Multiple      Live Births              Family History  Problem Relation Age of Onset   Heart disease Father        MI and stent placement   Arthritis Father    Hyperlipidemia Father    Hypertension Father    Cancer Sister         cervical   Cancer Brother        colon   Colon polyps Brother    Hyperlipidemia Mother    Pancreatic cancer Maternal Grandmother     Social History   Tobacco Use   Smoking status: Never   Smokeless tobacco: Never  Vaping Use   Vaping Use: Never used  Substance Use Topics   Alcohol use: Yes    Alcohol/week: 1.0 standard drink    Types: 1 Standard drinks or equivalent per week   Drug use: No    Home Medications Prior to Admission medications   Medication Sig Start Date End Date Taking? Authorizing Provider  blood glucose meter kit and supplies Dispense based on patient and insurance preference. Use up to four times daily as directed. (FOR ICD-10 E10.9, E11.9). 04/23/20   Pleas Koch, NP  Continuous Blood Gluc Receiver (FREESTYLE LIBRE 14 DAY READER) DEVI Use with sensor to check blood sugar. 11/18/20   Pleas Koch, NP  Continuous Blood Gluc Sensor (FREESTYLE LIBRE 14 DAY SENSOR) MISC Use as directed to  check blood sugars. 06/04/21   Pleas Koch, NP  glipiZIDE (GLIPIZIDE XL) 5 MG 24 hr tablet Take 1 tablet (5 mg total) by mouth daily with breakfast. For diabetes. 06/04/21   Pleas Koch, NP  Insulin Pen Needle (B-D UF III MINI PEN NEEDLES) 31G X 5 MM MISC Use nightly with insulin 04/22/20   Pleas Koch, NP  Lancets [facility] DELICA PLUS [id number]) MISC USE AS DIRECTED UP TO 4 TIMES A DAY 07/10/20   Pleas Koch, NP  metFORMIN (GLUCOPHAGE XR) 750 MG 24 hr tablet Take 1 tablet (750 mg total) by mouth 2 (two) times daily with a meal. For diabetes. 01/22/21   Pleas Koch, NP  ONETOUCH VERIO test strip USE AS INSTRUCTED TO TEST BLOOD SUGAR UP TO 4 TIMES A DAILY 11/27/20   Pleas Koch, NP  rosuvastatin (CRESTOR) 20 MG tablet TAKE 1 TABLET (20 MG TOTAL) BY MOUTH EVERY EVENING. FOR CHOLESTEROL 03/26/21   Pleas Koch, NP  sitaGLIPtin (JANUVIA) 100 MG tablet Take 1 tablet (100 mg total) by mouth daily. For diabetes. 01/24/21   Pleas Koch, NP  sulfamethoxazole-trimethoprim (BACTRIM DS) 800-160 MG tablet Take 1 tablet by mouth 2 (two) times daily. For urinary tract infection. 06/08/21   Pleas Koch, NP    Allergies    Patient has no known allergies.  Review of Systems   Review of Systems  Constitutional:  Negative for chills and fever.  HENT:  Negative for sore throat.   Respiratory:  Negative for shortness of breath.   Cardiovascular:  Negative for chest pain.  Gastrointestinal:  Negative for abdominal pain, nausea and vomiting.  Genitourinary:  Positive for difficulty urinating. Negative for flank pain, hematuria and urgency.  Musculoskeletal:  Negative for back pain.  Neurological:  Negative for light-headedness and headaches.  All other systems reviewed and are negative.  Physical Exam Updated Vital Signs BP 115/67   Pulse 75   Temp 98 F (36.7 C) (Oral)   Resp 12   SpO2 97%   Physical Exam Vitals and nursing note reviewed.  Constitutional:      Appearance: Normal appearance.     Comments: Non toxic appearance.   HENT:     Head: Normocephalic and atraumatic.     Mouth/Throat:     Mouth: Mucous membranes are moist.  Eyes:     Pupils: Pupils are equal, round, and reactive to light.  Cardiovascular:     Rate and Rhythm: Normal rate.  Pulmonary:     Effort: Pulmonary effort is normal.     Breath sounds: No wheezing or rales.     Comments: Lungs are clear to auscultation without any wheezing, rhonchi, rales. Abdominal:     General: Abdomen is flat.     Palpations: Abdomen is soft.     Tenderness: There is no right CVA tenderness or left CVA tenderness.     Comments: No bilateral CVA tenderness.  Abdomen is soft, nontender to palpation.  Musculoskeletal:     Cervical back: Normal range of motion and neck supple.  Neurological:     Mental Status: She is alert and oriented to person, place, and time.    ED Results / Procedures / Treatments   Labs (all labs ordered are listed, but  only abnormal results are displayed) Labs Reviewed  URINALYSIS, ROUTINE W REFLEX MICROSCOPIC - Abnormal; Notable for the following components:      Result Value   Color, Urine STRAW (*)  Glucose, UA >=500 (*)    Bacteria, UA RARE (*)    All other components within normal limits  COMPREHENSIVE METABOLIC PANEL - Abnormal; Notable for the following components:   Chloride 97 (*)    Glucose, Bld 233 (*)    All other components within normal limits  URINE CULTURE  CBC WITH DIFFERENTIAL/PLATELET    EKG None  Radiology No results found.  Procedures Procedures   Medications Ordered in ED Medications - No data to display  ED Course  I have reviewed the triage vital signs and the nursing notes.  Pertinent labs & imaging results that were available during my care of the patient were reviewed by me and considered in my medical decision making (see chart for details).  Clinical Course as of 06/08/21 1018  Tue Jun 08, 2021  0816 Bacteria, UA(!): RARE [JS]    Clinical Course User Index [JS] Janeece Fitting, PA-C   MDM Rules/Calculators/A&P    Patient with a past medical history of diabetes, hyperlipidemia presents to the ED with a chief complaint of "burning bladder ", symptoms have been ongoing for the past month.  Evaluated by PCP in the past, reports her urine was never cultured therefore she scheduled a second appointment.  According to chart review, patient was evaluated by Carlis Abbott NP 3 days ago.  After this visit urine was recultured again.  This grew Klebsiella pneumonia, according to no.  Patient was previously taking Azo.  On today's visit her UA shows increased 500 glucose, rare bacteria 0-5 white blood cell count.  She is also endorsing worsening fatigue.  No CVA tenderness bilaterally, no fevers I have a lower suspicion for Pyelo at this time.  No abdominal pain.  Vitals are within normal limits, she is afebrile while in the ED.  We discussed checking blood to rule out any  electrolyte derangement contributing to her fatigue.  Plan is to also contact outpatient NP to follow-up with urine culture for Klebsiella pneumonia.  Interpretation of her blood work with a CBC without any leukocytosis, CMP remarkable for some elevation of her glucose at 233.  LFTs are within normal limits.  Kidney function is unremarkable.  I have reviewed patient's chart, I do see a new prescription for Bactrim, this is sensitive to Klebsiella pneumonia for her UTI.  This was discussed with patient.  We did discuss follow-up outpatient for finishing the antibiotic.  She is agreeable of plan at this time.  Patient is otherwise with any other symptoms stable for discharge.  Return precautions discussed at length.  Portions of this note were generated with Lobby[employer]. Dictation errors may occur despite best attempts at proofreading.  Final Clinical Impression(s) / ED Diagnoses Final diagnoses:  Urinary tract infection without hematuria, site unspecified    Rx / DC Orders ED Discharge Orders     None        Janeece Fitting, PA-C 06/08/21 1018    Valarie Merino, MD 06/09/21 [phone]

## 2021-06-10 DIAGNOSIS — N3000 Acute cystitis without hematuria: Secondary | ICD-10-CM

## 2021-06-10 LAB — URINE CULTURE: Culture: >=100000 — AB

## 2021-06-10 MED ORDER — SULFAMETHOXAZOLE-TRIMETHOPRIM 800-160 MG PO TABS: 1.0000 | ORAL_TABLET | Freq: Two times a day (BID) | ORAL | 0 refills | Status: DC

## 2021-06-10 NOTE — Telephone Encounter (Addendum)
Pt called office and still feels flushed and warm to touch; pt does not have access to thermometer at work. Pt having body aches, burning and pain upon urination is not as bad as before abx that pt started on 06/08/21 but pt is still having these symptoms and thought by now she would not be having as much symptoms as she does. Mid lower abd bladder pain that is dull and intermittent; pt having bladder pain now. No back pain, no urinary frequency and no N&V.  Pt was seen by Allayne Gitelman NP on 06/04/21 and at [facility] ED on 06/07/21; pt just saw 2nd culture report on my chart and is concerned she will be without med over weekend and still having symptoms. [name] CMA sent result note to Allayne Gitelman NP who is out of office that pt did get abx on 06/08/21 and request cb after note reviewed by Allayne Gitelman NP. Pt voiced understanding that she would get a cb by lunchtime on 06/11/21. Pt is concerned that she will be without needed abx over weekend. Pt prefers answer to what can be done today., I spoke with DR Alphonsus Sias who said pt could need another 5 days of abx and to refill the bactrim for pt to [facility]. Pt voiced understanding and while I was speaking with Dr Alphonsus Sias pt found thermometer and her oral temp is 100. Pt said she never has a fever. Advised pt she could take tylenol for bodyaches and fever and Dr Alphonsus Sias was sending her a refill of bactrim. Pt is aware she will get a cb also after Allayne Gitelman NP reviews this note. Bactrim # 10 sent to [facility] as instructed. UC & ED precautions given and pt voiced understanding. Sending note to Dr Alphonsus Sias and Allayne Gitelman NP and [facility] CMA.

## 2021-06-10 NOTE — Telephone Encounter (Signed)
Yes, thanks for your assistance, I appreciate it.

## 2021-06-11 ENCOUNTER — Telehealth: Payer: Self-pay | Admitting: *Deleted

## 2021-06-11 NOTE — Telephone Encounter (Signed)
Please call patient...  Please have her give [place] a list of her symptoms.  Fevers? Flank pain? Stomach pain?  Any improvement at all on the antibiotics?

## 2021-06-11 NOTE — Telephone Encounter (Signed)
Post ED Visit - Positive Culture Follow-up  Culture report reviewed by antimicrobial stewardship pharmacist: [facility] []  , Pharm.D. []  Enzo Bi, Pharm.D., BCPS AQ-ID []  , Pharm.D., BCPS []  Celedonio Miyamoto, Pharm.D., BCPS []  [place], Garvin Fila.D., BCPS, AAHIVP []  , Pharm.D., BCPS, AAHIVP []  Georgina Pillion, PharmD, BCPS []  , PharmD, BCPS []  [place], PharmD, BCPS []  [place], PharmD []  , PharmD, BCPS []  Estella Husk, PharmD  Pharmacy Team []  Lysle Pearl, PharmD []  , PharmD []  Phillips Climes, PharmD []  , Rph []  Agapito Games) , PharmD []  Verlan Friends, PharmD []  , PharmD []  Mervyn Gay, PharmD []  , PharmD []  Vinnie Level, PharmD []  [facility], PharmD []  , PharmD []  Len Childs, PharmD   Positive urine culture Treated with TMP/SMX by PCP, organism sensitive to the same and no further patient follow-up is required at this time.  [facility] 06/11/2021, 11:05 AM

## 2021-06-11 NOTE — Telephone Encounter (Signed)
Left message to return call to our office.  

## 2021-06-13 NOTE — Telephone Encounter (Signed)
Called patient today (06/13/21) to check in on symptoms, no answer, left voicemail with instructions to call our office with an update tomorrow. Also instructed that if she's not feeling better then needs to go to the hospital.   Continue oral antibiotics, water intake, Tylenol.

## 2021-06-15 ENCOUNTER — Emergency Department
Admission: EM | Admit: 2021-06-15 | Discharge: 2021-06-15 | Disposition: A | Discharge status: Home / Self Care | Payer: 59 | Attending: Emergency Medicine | Admitting: Emergency Medicine

## 2021-06-15 ENCOUNTER — Emergency Department: Payer: 59

## 2021-06-15 ENCOUNTER — Telehealth: Payer: Self-pay | Admitting: *Deleted

## 2021-06-15 ENCOUNTER — Other Ambulatory Visit: Payer: Self-pay

## 2021-06-15 DIAGNOSIS — E119 Type 2 diabetes mellitus without complications: Secondary | ICD-10-CM | POA: Insufficient documentation

## 2021-06-15 DIAGNOSIS — Z7984 Long term (current) use of oral hypoglycemic drugs: Secondary | ICD-10-CM | POA: Insufficient documentation

## 2021-06-15 DIAGNOSIS — Z794 Long term (current) use of insulin: Secondary | ICD-10-CM | POA: Diagnosis not present

## 2021-06-15 DIAGNOSIS — N200 Calculus of kidney: Secondary | ICD-10-CM | POA: Diagnosis not present

## 2021-06-15 DIAGNOSIS — N289 Disorder of kidney and ureter, unspecified: Secondary | ICD-10-CM | POA: Diagnosis not present

## 2021-06-15 DIAGNOSIS — N2889 Other specified disorders of kidney and ureter: Secondary | ICD-10-CM

## 2021-06-15 DIAGNOSIS — M545 Low back pain, unspecified: Secondary | ICD-10-CM | POA: Diagnosis present

## 2021-06-15 DIAGNOSIS — R300 Dysuria: Secondary | ICD-10-CM

## 2021-06-15 LAB — BASIC METABOLIC PANEL
Anion gap: 8 (ref 5–15)
BUN: 12 mg/dL (ref 8–23)
CO2: 24 mmol/L (ref 22–32)
Calcium: 8.7 mg/dL — ABNORMAL LOW (ref 8.9–10.3)
Chloride: 99 mmol/L (ref 98–111)
Creatinine, Ser: 0.72 mg/dL (ref 0.44–1.00)
GFR, Estimated: >60 mL/min (ref >60)
Glucose, Bld: 197 mg/dL — ABNORMAL HIGH (ref 70–99)
Potassium: 3.3 mmol/L — ABNORMAL LOW (ref 3.5–5.1)
Sodium: 131 mmol/L — ABNORMAL LOW (ref 135–145)

## 2021-06-15 LAB — URINALYSIS, COMPLETE (UACMP) WITH MICROSCOPIC
Bacteria, UA: NONE SEEN
Bilirubin Urine: NEGATIVE
Glucose, UA: NEGATIVE mg/dL
Hgb urine dipstick: NEGATIVE
Ketones, ur: NEGATIVE mg/dL
Leukocytes,Ua: NEGATIVE
Nitrite: NEGATIVE
Protein, ur: NEGATIVE mg/dL
Specific Gravity, Urine: 1.004 — ABNORMAL LOW (ref 1.005–1.030)
pH: 6 (ref 5.0–8.0)

## 2021-06-15 LAB — HEPATIC FUNCTION PANEL
ALT: 35 U/L (ref 0–44)
AST: 31 U/L (ref 15–41)
Albumin: 4.5 g/dL (ref 3.5–5.0)
Alkaline Phosphatase: 73 U/L (ref 38–126)
Bilirubin, Direct: <0.1 mg/dL (ref 0.0–0.2)
Total Bilirubin: 0.5 mg/dL (ref 0.3–1.2)
Total Protein: 7.4 g/dL (ref 6.5–8.1)

## 2021-06-15 LAB — LIPASE, BLOOD: Lipase: 30 U/L (ref 11–51)

## 2021-06-15 LAB — CBC
HCT: 38.6 % (ref 36.0–46.0)
Hemoglobin: 13.4 g/dL (ref 12.0–15.0)
MCH: 30 pg (ref 26.0–34.0)
MCHC: 34.7 g/dL (ref 30.0–36.0)
MCV: 86.5 fL (ref 80.0–100.0)
Platelets: 220 10*3/uL (ref 150–400)
RBC: 4.46 MIL/uL (ref 3.87–5.11)
RDW: 11.9 % (ref 11.5–15.5)
WBC: 4.1 10*3/uL (ref 4.0–10.5)
nRBC: 0 % (ref 0.0–0.2)

## 2021-06-15 MED ORDER — POTASSIUM CHLORIDE CRYS ER 20 MEQ PO TBCR
40.0000 meq | EXTENDED_RELEASE_TABLET | Freq: Once | ORAL | Status: AC
  Administered 2021-06-15: 40 meq via ORAL
  Filled 2021-06-15: qty 2

## 2021-06-15 NOTE — Telephone Encounter (Signed)
See my chart message

## 2021-06-15 NOTE — Telephone Encounter (Signed)
Patient called stating that she now is having pain in her back close to her kidneys. Patient stated that the pain is dull and does not go away. Patient stated that the pain level now is a 7-8. Patient stated that the pain is deep. Patient stated that she is now in quarantine until Thursday because of covid.  Patient stated this morning the pain level was a 9. Patient wants to know what she should do now that the pain has moved and gotten worse.

## 2021-06-15 NOTE — ED Notes (Signed)
Patient transported to CT 

## 2021-06-15 NOTE — ED Provider Notes (Signed)
[facility] Emergency Department Provider Note  ____________________________________________   Event Date/Time   First MD Initiated Contact with Patient 06/15/21 1753     (approximate)  I have reviewed the triage vital signs and the nursing notes.   HISTORY  Chief Complaint Back Pain   HPI Leah Blankenship is a 60 y.o. female with a past medical history of HDL, DM and recurrent urinary tract infections currently on last dose of 8-day course of Bactrim for UTI diagnosed on 6/1 after patient states she had had almost a month of burning with urination who presents stating she feels some pain in her right lower back for the first time today.  She states she still has a burning with urination not sure if the medication is working or not.  She states she was diagnosed with COVID a little per week ago and had a cough initially that has been proving the last couple days and has had some diarrhea as well nonbloody over last couple days which she attributes to the [place].  She has not had any chest pain, shortness of breath, headache, earache, sore throat, fevers, blood in urine, left-sided or upper back pain, rash or other associated sick symptoms.  She has never had a kidney stone.  She states her right lower back pain comes and goes and feels a little dull.  She specifically denies any abdominal pain.         Past Medical History:  Diagnosis Date   Gestational diabetes    Hyperlipidemia     Patient Active Problem List   Diagnosis Date Noted   Dysuria 06/04/2021   Fatigue 05/19/2021   Acute cystitis without hematuria 05/19/2021   Leukopenia 08/18/2020   Type 2 diabetes mellitus without complication, without long-term current use of insulin ([place]) 04/22/2020   Hypercholesteremia 05/05/2015    Past Surgical History:  Procedure Laterality Date   FRACTURE SURGERY  1985   left leg    Prior to Admission medications   Medication Sig Start Date End Date Taking?  Authorizing Provider  blood glucose meter kit and supplies Dispense based on patient and insurance preference. Use up to four times daily as directed. (FOR ICD-10 E10.9, E11.9). 04/23/20   Pleas Koch, NP  Continuous Blood Gluc Receiver (FREESTYLE LIBRE 14 DAY READER) DEVI Use with sensor to check blood sugar. 11/18/20   Pleas Koch, NP  Continuous Blood Gluc Sensor (FREESTYLE LIBRE 14 DAY SENSOR) MISC Use as directed to check blood sugars. 06/04/21   Pleas Koch, NP  glipiZIDE (GLIPIZIDE XL) 5 MG 24 hr tablet Take 1 tablet (5 mg total) by mouth daily with breakfast. For diabetes. 06/04/21   Pleas Koch, NP  Insulin Pen Needle (B-D UF III MINI PEN NEEDLES) 31G X 5 MM MISC Use nightly with insulin 04/22/20   Pleas Koch, NP  Lancets [facility] DELICA PLUS [id number]) MISC USE AS DIRECTED UP TO 4 TIMES A DAY 07/10/20   Pleas Koch, NP  metFORMIN (GLUCOPHAGE XR) 750 MG 24 hr tablet Take 1 tablet (750 mg total) by mouth 2 (two) times daily with a meal. For diabetes. 01/22/21   Pleas Koch, NP  ONETOUCH VERIO test strip USE AS INSTRUCTED TO TEST BLOOD SUGAR UP TO 4 TIMES A DAILY 11/27/20   Pleas Koch, NP  rosuvastatin (CRESTOR) 20 MG tablet TAKE 1 TABLET (20 MG TOTAL) BY MOUTH EVERY EVENING. FOR CHOLESTEROL 03/26/21   Pleas Koch, NP  sitaGLIPtin (JANUVIA) 100 MG tablet Take 1 tablet (100 mg total) by mouth daily. For diabetes. 01/24/21   Pleas Koch, NP  sulfamethoxazole-trimethoprim (BACTRIM DS) 800-160 MG tablet Take 1 tablet by mouth 2 (two) times daily. For urinary tract infection. 06/10/21   Venia Carbon, MD    Allergies Patient has no known allergies.  Family History  Problem Relation Age of Onset   Heart disease Father        MI and stent placement   Arthritis Father    Hyperlipidemia Father    Hypertension Father    Cancer Sister        cervical   Cancer Brother        colon   Colon polyps Brother    Hyperlipidemia Mother     Pancreatic cancer Maternal Grandmother     Social History Social History   Tobacco Use   Smoking status: Never   Smokeless tobacco: Never  Vaping Use   Vaping Use: Never used  Substance Use Topics   Alcohol use: Yes    Alcohol/week: 1.0 standard drink    Types: 1 Standard drinks or equivalent per week   Drug use: No    Review of Systems  Review of Systems  Constitutional:  Negative for chills and fever.  HENT:  Negative for sore throat.   Eyes:  Negative for pain.  Respiratory:  Positive for cough. Negative for stridor.   Cardiovascular:  Negative for chest pain.  Gastrointestinal:  Positive for diarrhea. Negative for vomiting.  Genitourinary:  Positive for dysuria.  Musculoskeletal:  Positive for back pain.  Skin:  Negative for rash.  Neurological:  Negative for seizures, loss of consciousness and headaches.  Psychiatric/Behavioral:  Negative for suicidal ideas.   All other systems reviewed and are negative.    ____________________________________________   PHYSICAL EXAM:  VITAL SIGNS: ED Triage Vitals  Enc Vitals Group     BP 06/15/21 1642 (!) 147/81     Pulse Rate 06/15/21 1642 94     Resp 06/15/21 1642 18     Temp 06/15/21 1642 98.1 F (36.7 C)     Temp src --      SpO2 06/15/21 1642 100 %     Weight --      Height --      Head Circumference --      Peak Flow --      Pain Score 06/15/21 1639 5     Pain Loc --      Pain Edu? --      Excl. in [place]? --    Vitals:   06/15/21 1642  BP: (!) 147/81  Pulse: 94  Resp: 18  Temp: 98.1 F (36.7 C)  SpO2: 100%   Physical Exam Vitals and nursing note reviewed.  Constitutional:      General: She is not in acute distress.    Appearance: She is well-developed.  HENT:     Head: Normocephalic and atraumatic.     Right Ear: External ear normal.     Left Ear: External ear normal.     Nose: Nose normal.     Mouth/Throat:     Mouth: Mucous membranes are moist.  Eyes:     Conjunctiva/sclera: Conjunctivae  normal.  Cardiovascular:     Rate and Rhythm: Normal rate and regular rhythm.     Heart sounds: No murmur heard. Pulmonary:     Effort: Pulmonary effort is normal. No respiratory distress.     Breath  sounds: Normal breath sounds.  Abdominal:     Palpations: Abdomen is soft.     Tenderness: There is no abdominal tenderness.  Musculoskeletal:     Cervical back: Neck supple.  Skin:    General: Skin is warm and dry.     Capillary Refill: Capillary refill takes less than 2 seconds.  Neurological:     Mental Status: She is alert and oriented to person, place, and time.  Psychiatric:        Mood and Affect: Mood normal.     ____________________________________________   LABS (all labs ordered are listed, but only abnormal results are displayed)  Labs Reviewed  BASIC METABOLIC PANEL - Abnormal; Notable for the following components:      Result Value   Sodium 131 (*)    Potassium 3.3 (*)    Glucose, Bld 197 (*)    Calcium 8.7 (*)    All other components within normal limits  URINALYSIS, COMPLETE (UACMP) WITH MICROSCOPIC - Abnormal; Notable for the following components:   Color, Urine STRAW (*)    APPearance CLEAR (*)    Specific Gravity, Urine 1.004 (*)    All other components within normal limits  URINE CULTURE  CBC  LIPASE, BLOOD  HEPATIC FUNCTION PANEL   ____________________________________________  EKG  ____________________________________________  RADIOLOGY  ED MD interpretation: CT abdomen pelvis with somewhat malrotated right kidney with mild hydro but no hydroureter or clear obstruction or perinephric stranding.  No other clear acute abdominopelvic process.  4 mm stone in the right upper pole of the kidney.  Official radiology report(s): CT Renal Stone Study  Result Date: 06/15/2021 CLINICAL DATA:  Flank pain EXAM: CT ABDOMEN AND PELVIS WITHOUT CONTRAST TECHNIQUE: Multidetector CT imaging of the abdomen and pelvis was performed following the standard protocol  without IV contrast. COMPARISON:  None. FINDINGS: Lower chest: Lung bases demonstrate no acute consolidation or effusion. Normal cardiac size. Hepatobiliary: No focal liver abnormality is seen. No gallstones, gallbladder wall thickening, or biliary dilatation. Pancreas: Unremarkable. No pancreatic ductal dilatation or surrounding inflammatory changes. Spleen: Normal in size without focal abnormality. Adrenals/Urinary Tract: Adrenal glands are within normal limits. Negative for left hydronephrosis or ureteral stone. Malrotated right kidney with appearance of mild right hydronephrosis but no hydroureter or obstructing stone. 4 mm stone upper pole of the right kidney. The bladder is unremarkable Stomach/Bowel: Stomach is within normal limits. Appendix appears normal. No evidence of bowel wall thickening, distention, or inflammatory changes. Vascular/Lymphatic: Nonaneurysmal aorta. Mild aortic atherosclerosis. No suspicious nodes. Reproductive: Uterus and bilateral adnexa are unremarkable. Other: Negative for free air or free fluid. Musculoskeletal: No acute or suspicious osseous abnormality. IMPRESSION: 1. Malrotated right kidney with mild hydronephrosis but no hydroureter or obstructing stone. Intrarenal stone on the right. 2. No CT evidence for acute intra-abdominal or pelvic abnormality. Electronically Signed   By: Donavan Foil M.D.   On: 06/15/2021 18:37    ____________________________________________   PROCEDURES  Procedure(s) performed (including Critical Care):  Procedures   ____________________________________________   INITIAL IMPRESSION / ASSESSMENT AND PLAN / ED COURSE     Patient presents with above-stated history exam for approximately 1 day of right lower back pain described as intermittent and dull in the setting of currently taking Bactrim for a UTI and getting over some COVID symptoms including cough and diarrhea.  She is never had a kidney stone and denies any current symptoms or  significant pain on arrival.  She is afebrile and hemodynamically stable.  Her abdomen is  soft and nontender throughout and she has no significant CVA tenderness.  Differential includes possible pyelonephritis, diverticulitis, cholecystitis, kidney stone versus persistent cystitis.  CT abdomen pelvis with somewhat malrotated right kidney with mild hydro but no hydroureter or clear obstruction or perinephric stranding.  No other clear acute abdominopelvic process.  4 mm stone in the right upper pole of the kidney.  BMP with a K of 3.3 and a glucose of 197 compared to 233 7 days ago without any other significant electrode or metabolic derangements.  Kidney function is WNL.  CBC without leukocytosis or acute anemia.  Given absence of fever, leukocytosis or tenderness on exam in the right lower quadrant low suspicion for pyonephritis.  No tenderness in the right upper quadrant findings on hepatic function panel to suggest cholestatic process or acute cholecystitis.  Lipase not consistent with pancreatitis.  CT shows no evidence of diverticulitis, peritonitis or other clear acute process unclear if the malpositioned right kidney is contributing to symptoms or not.  Unclear chronicity of this.  Suspect stone in the kidney is likely incidental and not symptomatic.  Urine today does not appear infected and I advised patient to complete her course of Bactrim.  However given abnormal findings on CT will refer to urology.  She is amenable with plan.  Discharged stable condition.  Strict return precautions advised and discussed.       ____________________________________________   FINAL CLINICAL IMPRESSION(S) / ED DIAGNOSES  Final diagnoses:  Dysuria  Kidney stone  Abnormal position of kidney    Medications  potassium chloride SA (KLOR-CON) CR tablet 40 mEq (has no administration in time range)     ED Discharge Orders     None        Note:  This document was prepared using Dragon voice  recognition software and may include unintentional dictation errors.    Lucrezia Starch, MD 06/15/21 Einar Crow

## 2021-06-15 NOTE — ED Triage Notes (Addendum)
Pt comes with c/o back pain and bacteria in her urine. Pt states she was prescribed meds and is on the last two days of meds. Pt states she was prescribed it for 8 days.  Pt states she came in today because the pain has gotten worse and was advised to come and get her urine checked out and her kidney function.  Pt is COVID+ on gets out of quarantine on Thursday.

## 2021-06-17 LAB — URINE CULTURE: Culture: NO GROWTH

## 2021-06-24 ENCOUNTER — Encounter: Payer: Self-pay | Admitting: Urology

## 2021-06-24 ENCOUNTER — Ambulatory Visit
Admission: RE | Admit: 2021-06-24 | Discharge: 2021-06-24 | Disposition: A | Discharge status: Home / Self Care | Payer: 59 | Source: Ambulatory Visit | Attending: Urology | Admitting: Urology

## 2021-06-24 ENCOUNTER — Ambulatory Visit (INDEPENDENT_AMBULATORY_CARE_PROVIDER_SITE_OTHER): Payer: 59 | Admitting: Urology

## 2021-06-24 ENCOUNTER — Other Ambulatory Visit: Payer: Self-pay

## 2021-06-24 VITALS — BP 131/85 | HR 6 | Ht 66.0 in | Wt 146.0 lb

## 2021-06-24 DIAGNOSIS — N200 Calculus of kidney: Secondary | ICD-10-CM

## 2021-06-24 DIAGNOSIS — N390 Urinary tract infection, site not specified: Secondary | ICD-10-CM

## 2021-06-24 NOTE — Patient Instructions (Signed)
D-mannose and cranberry tabs

## 2021-06-24 NOTE — Progress Notes (Signed)
06/24/2021 10:24 AM   [name] 11/24/1961 [id number]  Referring provider: Pleas Koch, NP [place],  [place] [address]  Chief Complaint  Patient presents with   Nephrolithiasis    New Patient    HPI: Leah Blankenship is a 60 y.o. female who presents in follow-up for recent ED visit.  Seen [facility] ED 06/15/2021 with right low back pain described as dull and intermittent Was completing a course of Septra DS for a UTI that was prescribed by her PCP Urine culture 06/08/2021 + Klebsiella with follow-up urine culture at the time of her ED visit showing no growth Stone protocol CT abdomen pelvis was performed which showed incomplete rotation of the right kidney with mild right hydronephrosis but no ureteral calculus.  There was a nonobstructing 4 mm right upper pole calculus Since completing her antibiotic course she states is able to "feel the shape of her kidney" in the right flank region History of incomplete bladder emptying in her 41s stating this resolved after being treated with catheterization + Diabetes which has been difficult to control since having COVID; has endocrinology appointment in the near future Also treated for UTI February 2022 with no prior history of UTIs prior to that time.  No relation of symptom onset to intercourse   PMH: Past Medical History:  Diagnosis Date   Gestational diabetes    Hyperlipidemia     Surgical History: Past Surgical History:  Procedure Laterality Date   FRACTURE SURGERY  1985   left leg    Home Medications:  Allergies as of 06/24/2021   No Known Allergies      Medication List        Accurate as of June 24, 2021 10:24 AM. If you have any questions, ask your nurse or doctor.          STOP taking these medications    B-D UF III MINI PEN NEEDLES 31G X 5 MM Misc Generic drug: Insulin Pen Needle Stopped by: Abbie Sons, MD   OneTouch Delica Plus [id number] Misc Stopped by: Abbie Sons, MD    sulfamethoxazole-trimethoprim 800-160 MG tablet Commonly known as: BACTRIM DS Stopped by: Abbie Sons, MD       TAKE these medications    blood glucose meter kit and supplies Dispense based on patient and insurance preference. Use up to four times daily as directed. (FOR ICD-10 E10.9, E11.9).   FreeStyle Libre 14 Day Reader Kerrin Mo Use with sensor to check blood sugar.   FreeStyle Libre 14 Day Sensor Misc Use as directed to check blood sugars.   glipiZIDE 5 MG 24 hr tablet Commonly known as: glipiZIDE XL Take 1 tablet (5 mg total) by mouth daily with breakfast. For diabetes.   metFORMIN 750 MG 24 hr tablet Commonly known as: Glucophage XR Take 1 tablet (750 mg total) by mouth 2 (two) times daily with a meal. For diabetes.   OneTouch Verio test strip Generic drug: glucose blood USE AS INSTRUCTED TO TEST BLOOD SUGAR UP TO 4 TIMES A DAILY   rosuvastatin 20 MG tablet Commonly known as: CRESTOR TAKE 1 TABLET (20 MG TOTAL) BY MOUTH EVERY EVENING. FOR CHOLESTEROL   sitaGLIPtin 100 MG tablet Commonly known as: Januvia Take 1 tablet (100 mg total) by mouth daily. For diabetes.        Allergies: No Known Allergies  Family History: Family History  Problem Relation Age of Onset   Heart disease Father  MI and stent placement   Arthritis Father    Hyperlipidemia Father    Hypertension Father    Cancer Sister        cervical   Cancer Brother        colon   Colon polyps Brother    Hyperlipidemia Mother    Pancreatic cancer Maternal Grandmother     Social History:  reports that she has never smoked. She has never used smokeless tobacco. She reports current alcohol use of about 1.0 standard drink of alcohol per week. She reports that she does not use drugs.   Physical Exam: BP 131/85   Pulse (!) 6   Ht _0  (1.676 m)   Wt 146 lb (66.2 kg)   BMI 23.57 kg/m   Constitutional:  Alert and oriented, No acute distress. HEENT: [place] AT, moist mucus membranes.   Trachea midline, no masses. Cardiovascular: No clubbing, cyanosis, or edema. Respiratory: Normal respiratory effort, no increased work of breathing. Neurologic: Grossly intact, no focal deficits, moving all 4 extremities. Psychiatric: Normal mood and affect.  Laboratory Data:  Urinalysis Dipstick/microscopy negative   Pertinent Imaging: CT images personally reviewed and interpreted.  Incomplete rotation of the right kidney.  No ureteral calculi seen.  4 mm upper pole calculus which may be parenchymal  CT Renal Stone Study  Narrative CLINICAL DATA:  Flank pain  EXAM: CT ABDOMEN AND PELVIS WITHOUT CONTRAST  TECHNIQUE: Multidetector CT imaging of the abdomen and pelvis was performed following the standard protocol without IV contrast.  COMPARISON:  None.  FINDINGS: Lower chest: Lung bases demonstrate no acute consolidation or effusion. Normal cardiac size.  Hepatobiliary: No focal liver abnormality is seen. No gallstones, gallbladder wall thickening, or biliary dilatation.  Pancreas: Unremarkable. No pancreatic ductal dilatation or surrounding inflammatory changes.  Spleen: Normal in size without focal abnormality.  Adrenals/Urinary Tract: Adrenal glands are within normal limits. Negative for left hydronephrosis or ureteral stone. Malrotated right kidney with appearance of mild right hydronephrosis but no hydroureter or obstructing stone. 4 mm stone upper pole of the right kidney. The bladder is unremarkable  Stomach/Bowel: Stomach is within normal limits. Appendix appears normal. No evidence of bowel wall thickening, distention, or inflammatory changes.  Vascular/Lymphatic: Nonaneurysmal aorta. Mild aortic atherosclerosis. No suspicious nodes.  Reproductive: Uterus and bilateral adnexa are unremarkable.  Other: Negative for free air or free fluid.  Musculoskeletal: No acute or suspicious osseous abnormality.  IMPRESSION: 1. Malrotated right kidney with mild  hydronephrosis but no hydroureter or obstructing stone. Intrarenal stone on the right. 2. No CT evidence for acute intra-abdominal or pelvic abnormality.   Electronically Signed By: Donavan Foil M.D. On: 06/15/2021 18:37   Assessment & Plan:    1. Nephrolithiasis Unlikely a source of her right back pain or recurrent UTI 1 month follow-up and if still symptomatic consider CTU  2.  Recurrent UTI We discussed that lower tract UTIs are common in postmenopausal women due to local changes in the vaginal flora and the short urethral length.  Potential supplements that may prevent infection were discussed including D-mannose and cranberry   Abbie Sons, MD  [facility] [address], [place], [place] [address] [phone]

## 2021-06-25 LAB — URINALYSIS, COMPLETE
Bilirubin, UA: NEGATIVE
Ketones, UA: NEGATIVE
Leukocytes,UA: NEGATIVE
Nitrite, UA: NEGATIVE
Protein,UA: NEGATIVE
RBC, UA: NEGATIVE
Specific Gravity, UA: 1.01 (ref 1.005–1.030)
Urobilinogen, Ur: 0.2 mg/dL (ref 0.2–1.0)
pH, UA: 6 (ref 5.0–7.5)

## 2021-06-25 LAB — MICROSCOPIC EXAMINATION
Bacteria, UA: NONE SEEN
RBC, Urine: NONE SEEN /hpf (ref 0–2)

## 2021-06-28 ENCOUNTER — Encounter: Payer: Self-pay | Admitting: *Deleted

## 2021-07-21 ENCOUNTER — Ambulatory Visit: Payer: 59 | Admitting: Urology

## 2021-07-28 ENCOUNTER — Encounter: Payer: Self-pay | Admitting: Physician Assistant

## 2021-07-28 ENCOUNTER — Ambulatory Visit (INDEPENDENT_AMBULATORY_CARE_PROVIDER_SITE_OTHER): Payer: 59 | Admitting: Physician Assistant

## 2021-07-28 ENCOUNTER — Other Ambulatory Visit: Payer: Self-pay

## 2021-07-28 VITALS — BP 125/79 | HR 88 | Ht 67.0 in | Wt 144.0 lb

## 2021-07-28 DIAGNOSIS — Z87442 Personal history of urinary calculi: Secondary | ICD-10-CM

## 2021-07-28 DIAGNOSIS — R109 Unspecified abdominal pain: Secondary | ICD-10-CM | POA: Diagnosis not present

## 2021-07-28 DIAGNOSIS — R3129 Other microscopic hematuria: Secondary | ICD-10-CM | POA: Diagnosis not present

## 2021-07-28 LAB — URINALYSIS, COMPLETE
Bilirubin, UA: NEGATIVE
Ketones, UA: NEGATIVE
Leukocytes,UA: NEGATIVE
Nitrite, UA: NEGATIVE
Protein,UA: NEGATIVE
Specific Gravity, UA: 1.015 (ref 1.005–1.030)
Urobilinogen, Ur: NEGATIVE mg/dL (ref 0.2–1.0)
pH, UA: 5.5 (ref 5.0–7.5)

## 2021-07-28 LAB — MICROSCOPIC EXAMINATION: Bacteria, UA: NONE SEEN

## 2021-07-28 NOTE — Progress Notes (Signed)
07/28/2021 2:03 PM   Leah Blankenship 10-13-1961 [id number]  CC: Chief Complaint  Patient presents with   Nephrolithiasis   Recurrent UTI    HPI: Leah Blankenship is a 60 y.o. female with PMH incomplete rotation of the right kidney, mild right hydronephrosis, 4 mm right upper pole calculus, and intermittent right flank pressure/discomfort who presents today for symptom recheck and UA.   Today she reports she is continue to have intermittent right flank pressure and discomfort that she is concerned is associated with her kidney stone.  She has additional questions about her recent imaging findings including phleboliths and 1 versus 2 right renal stones.  She wonders if she would be able to pass the stones on her own and what options are available for proactive management.  Notably, she followed up with endocrinology since her last visit with Dr. Bernardo Heater and was diagnosed with adult onset type 1 diabetes rather than type 2 diabetes.  She is a never smoker and denies a family history of urologic cancers.  In-office UA today positive for 3+ glucose and trace intact blood; urine microscopy with 3-10 RBCs/HPF.  PMH: Past Medical History:  Diagnosis Date   Gestational diabetes    Hyperlipidemia     Surgical History: Past Surgical History:  Procedure Laterality Date   FRACTURE SURGERY  1985   left leg    Home Medications:  Allergies as of 07/28/2021   No Known Allergies      Medication List        Accurate as of July 28, 2021  2:03 PM. If you have any questions, ask your nurse or doctor.          blood glucose meter kit and supplies Dispense based on patient and insurance preference. Use up to four times daily as directed. (FOR ICD-10 E10.9, E11.9).   Cranberry 500 MG Tabs Take by mouth.   FreeStyle Libre 14 Day Reader Kerrin Mo Use with sensor to check blood sugar.   FreeStyle Libre 14 Day Sensor Misc Use as directed to check blood sugars.   glipiZIDE 5 MG 24 hr  tablet Commonly known as: glipiZIDE XL Take 1 tablet (5 mg total) by mouth daily with breakfast. For diabetes.   Lantus SoloStar 100 UNIT/ML Solostar Pen Generic drug: insulin glargine Inject into the skin.   metFORMIN 750 MG 24 hr tablet Commonly known as: Glucophage XR Take 1 tablet (750 mg total) by mouth 2 (two) times daily with a meal. For diabetes.   OneTouch Verio test strip Generic drug: glucose blood USE AS INSTRUCTED TO TEST BLOOD SUGAR UP TO 4 TIMES A DAILY   rosuvastatin 20 MG tablet Commonly known as: CRESTOR TAKE 1 TABLET (20 MG TOTAL) BY MOUTH EVERY EVENING. FOR CHOLESTEROL   sitaGLIPtin 100 MG tablet Commonly known as: Januvia Take 1 tablet (100 mg total) by mouth daily. For diabetes.        Allergies:  No Known Allergies  Family History: Family History  Problem Relation Age of Onset   Heart disease Father        MI and stent placement   Arthritis Father    Hyperlipidemia Father    Hypertension Father    Cancer Sister        cervical   Cancer Brother        colon   Colon polyps Brother    Hyperlipidemia Mother    Pancreatic cancer Maternal Grandmother     Social History:   reports that she  has never smoked. She has never used smokeless tobacco. She reports current alcohol use of about 1.0 standard drink of alcohol per week. She reports that she does not use drugs.  Physical Exam: BP 125/79   Pulse 88   Ht 5' 7"  (1.702 m)   Wt 144 lb (65.3 kg)   BMI 22.55 kg/m   Constitutional:  Alert and oriented, no acute distress, nontoxic appearing HEENT: [place], AT Cardiovascular: No clubbing, cyanosis, or edema Respiratory: Normal respiratory effort, no increased work of breathing Skin: No rashes, bruises or suspicious lesions Neurologic: Grossly intact, no focal deficits, moving all 4 extremities Psychiatric: Normal mood and affect  Laboratory Data: Results for orders placed or performed in visit on 07/28/21  Microscopic Examination   Urine   Result Value Ref Range   WBC, UA 0-5 0 - 5 /hpf   RBC 3-10 (A) 0 - 2 /hpf   Epithelial Cells (non renal) 0-10 0 - 10 /hpf   Bacteria, UA None seen None seen/Few  Urinalysis, Complete  Result Value Ref Range   Specific Gravity, UA 1.015 1.005 - 1.030   pH, UA 5.5 5.0 - 7.5   Color, UA Yellow Yellow   Appearance Ur Clear Clear   Leukocytes,UA Negative Negative   Protein,UA Negative Negative/Trace   Glucose, UA 3+ (A) Negative   Ketones, UA Negative Negative   RBC, UA Trace (A) Negative   Bilirubin, UA Negative Negative   Urobilinogen, Ur Negative 0.2 - 1.0 mg/dL   Nitrite, UA Negative Negative   Microscopic Examination See below:    Assessment & Plan:   1. Microscopic hematuria Patient remains symptomatic today.  UA today notable for microscopic hematuria.  In combination with her mild right hydronephrosis, I recommend pursuing hematuria work-up at this time.  I had a lengthy conversation today with the patient regarding the etiology of blood in the urine.  I explained that blood in the urine can be caused by a myriad of factors, including but not limited to infection, stones, cysts, anticoagulation, and urinary tract malignancies.  I explained that the recommended work-up for blood in the urine is twofold and includes a CT urogram for evaluation of the upper urinary tract including kidneys and ureters as well as a cystoscopy for evaluation of the urethra and bladder.  I explained that these two studies complement one another in reviewing the entire urinary tract for possible causes of bleeding.  I recommended that we proceed with this at this time.  Patient agreed; CTU ordered and follow-up cysto with CTU results scheduled. - Urinalysis, Complete - CT HEMATURIA WORKUP; Future  2. Flank pain with history of urolithiasis I agree with Dr. Bernardo Heater that her small right renal stones are not likely the source of her flank discomfort.  Also reassured the patient that phleboliths are completely  benign finding that do not warrant further investigation or intervention.  Return in about 4 weeks (around 08/25/2021) for Cysto and CTU results.  Debroah Loop, PA-C  [facility] [address] [place], [place] [address] [phone]

## 2021-08-24 ENCOUNTER — Ambulatory Visit
Admission: RE | Admit: 2021-08-24 | Discharge: 2021-08-24 | Disposition: A | Discharge status: Home / Self Care | Payer: 59 | Source: Ambulatory Visit | Attending: Physician Assistant | Admitting: Physician Assistant

## 2021-08-24 ENCOUNTER — Other Ambulatory Visit: Payer: Self-pay

## 2021-08-24 DIAGNOSIS — R3129 Other microscopic hematuria: Secondary | ICD-10-CM | POA: Insufficient documentation

## 2021-08-24 LAB — POCT I-STAT CREATININE: Creatinine, Ser: 0.6 mg/dL (ref 0.44–1.00)

## 2021-08-24 MED ORDER — IOHEXOL 350 MG/ML SOLN
100.0000 mL | Freq: Once | INTRAVENOUS | Status: AC | PRN
  Administered 2021-08-24: 100 mL via INTRAVENOUS

## 2021-08-26 IMAGING — CT CT RENAL STONE PROTOCOL
2 of 4 series · 16 of 46 positions shown, 18 images · non-contrast
Comparison: None.

CLINICAL DATA: Flank pain

EXAM:
CT ABDOMEN AND PELVIS WITHOUT CONTRAST
TECHNIQUE: Multidetector CT imaging of the abdomen and pelvis was performed
following the standard protocol without IV contrast.

[Series 2: stone full standard · axial · 0.75mm/px · z∈[-354,+62]mm · 13 of 91 slices shown, 15 images]
[im 4/91  soft-tissue]
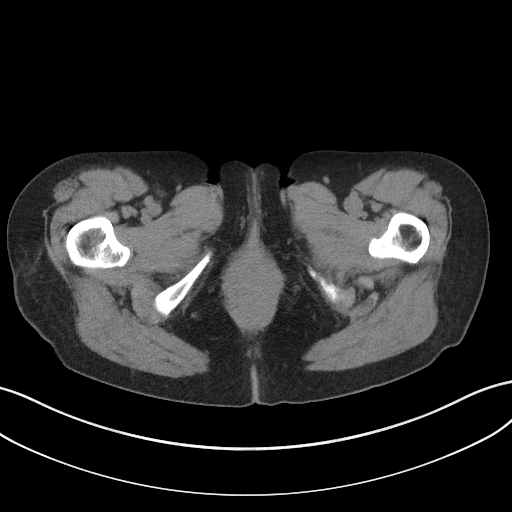
[im 4/91  bone]
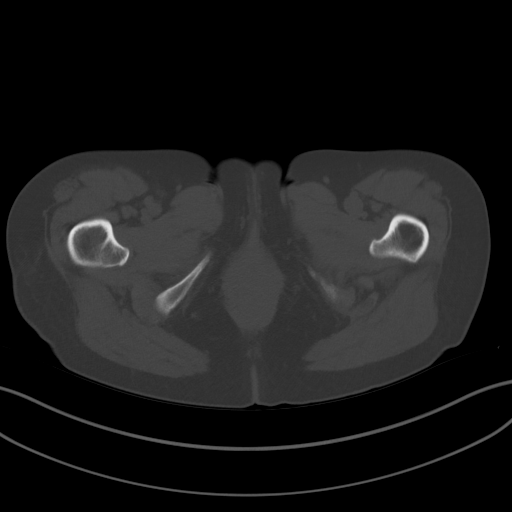
[im 12/91  soft-tissue]
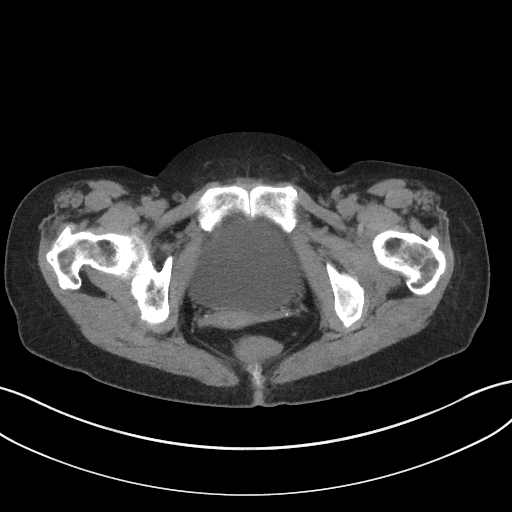
[im 20/91  soft-tissue]
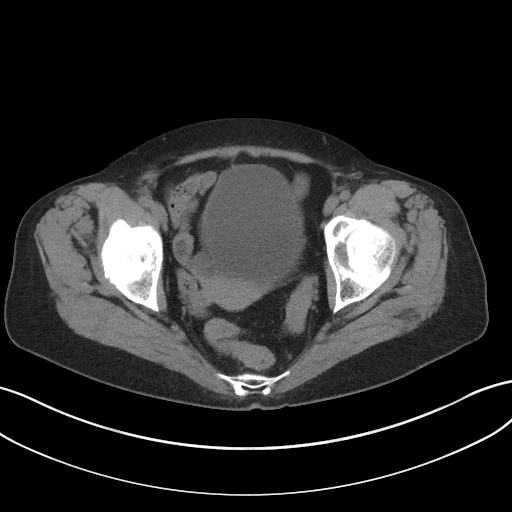
[im 24/91  soft-tissue]
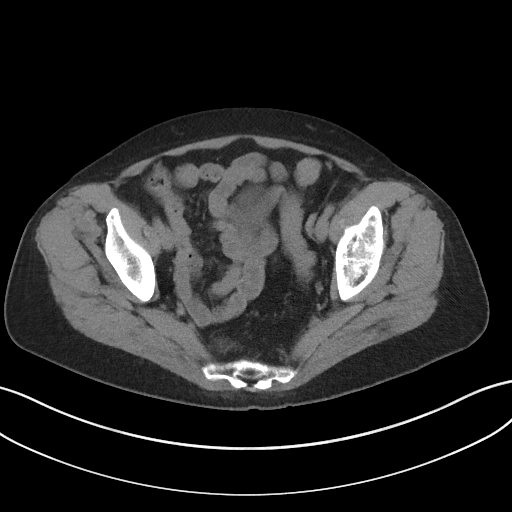
[im 32/91  soft-tissue]
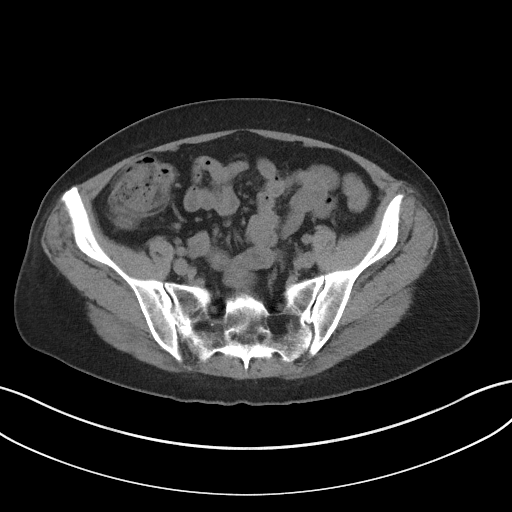
[im 40/91  soft-tissue]
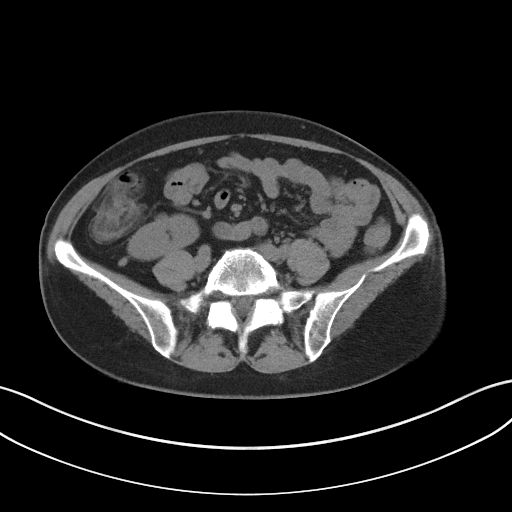
[im 47/91  soft-tissue]
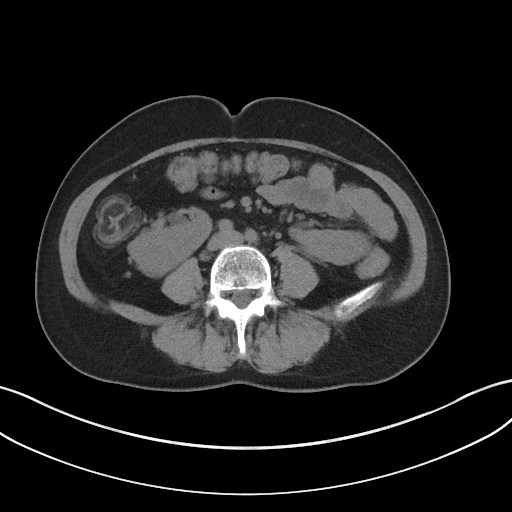
[im 51/91  soft-tissue]
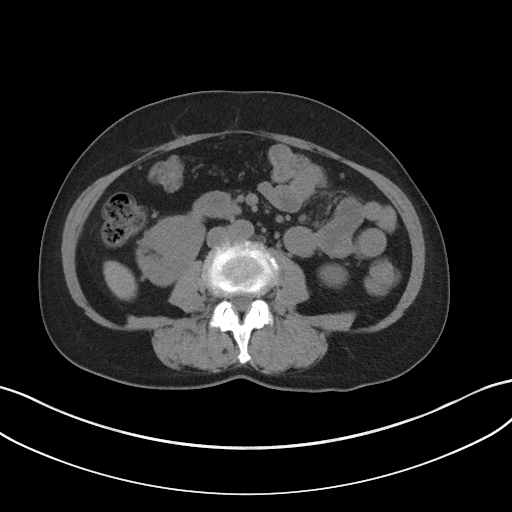
[im 59/91  soft-tissue]
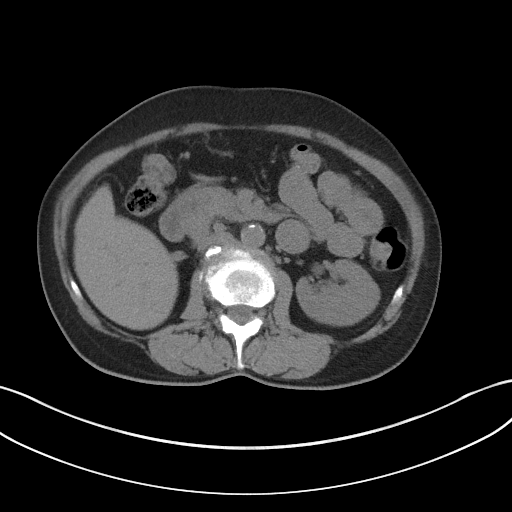
[im 59/91  bone]
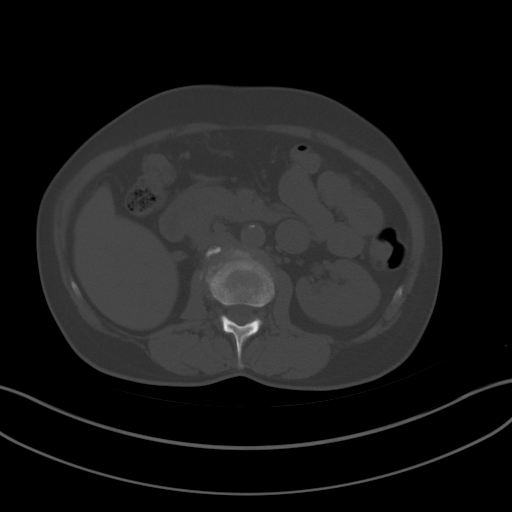
[im 67/91  soft-tissue]
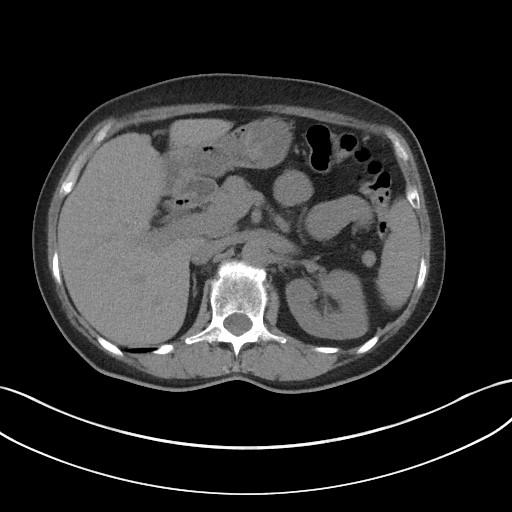
[im 71/91  soft-tissue]
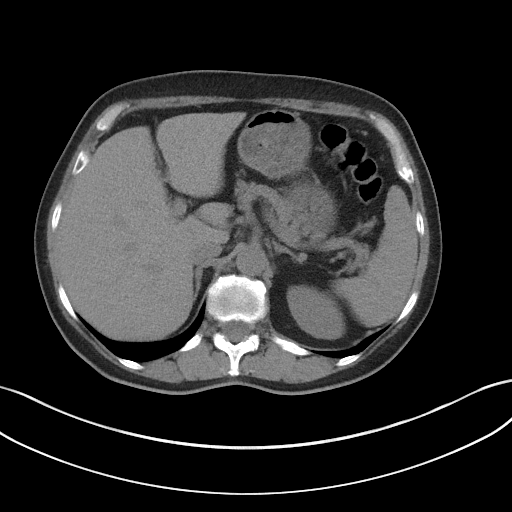
[im 79/91  soft-tissue]
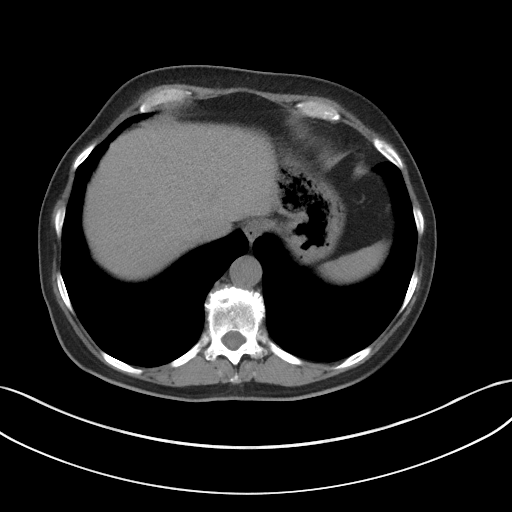
[im 87/91  soft-tissue]
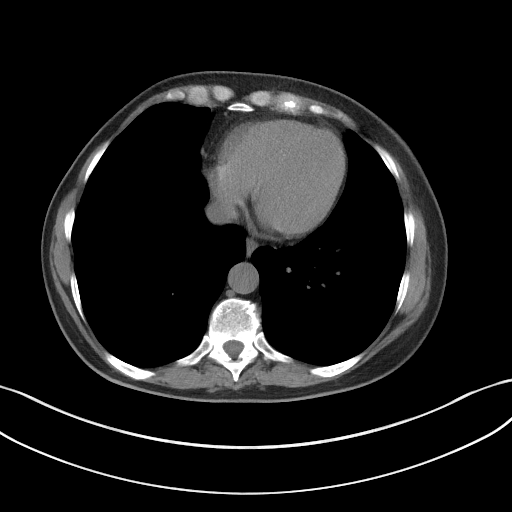

[Series 5: coronal · coronal · 0.76mm/px · 3 of 150 slices shown]
[im 50/150  soft-tissue]
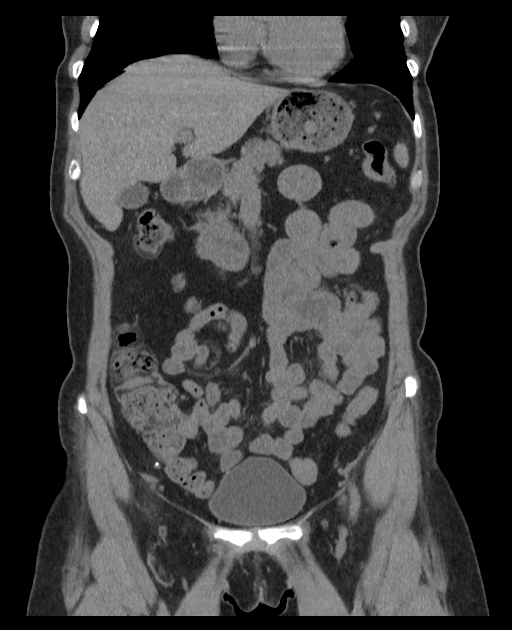
[im 67/150  soft-tissue]
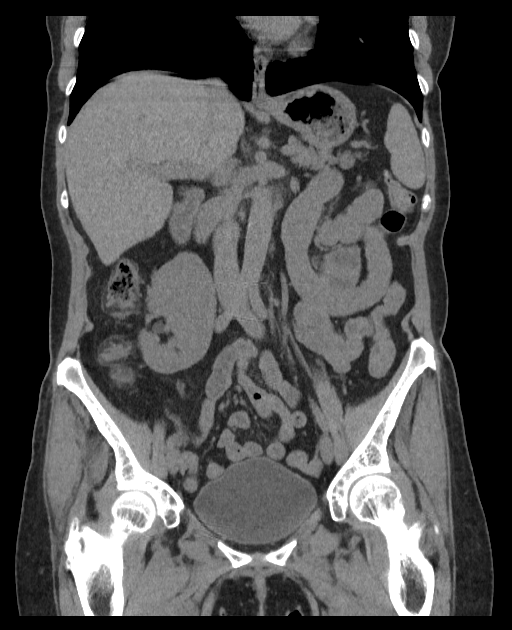
[im 83/150  soft-tissue]
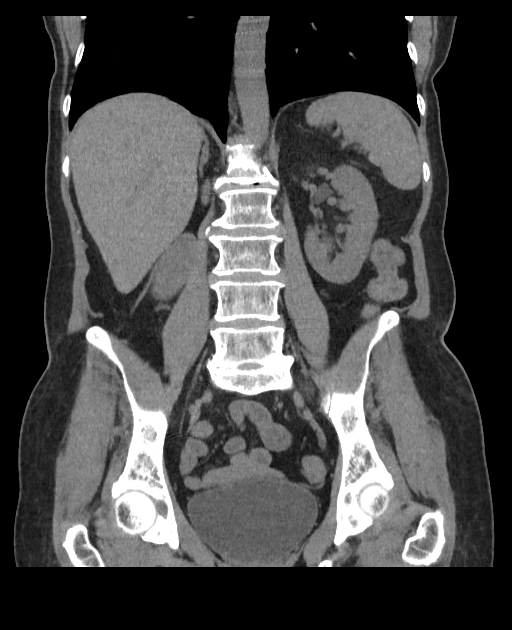

[16 of 46 positions shown; findings below may reference images not displayed]

FINDINGS: Lower chest: Lung bases demonstrate no acute consolidation or
effusion. Normal cardiac size.

Hepatobiliary: No focal liver abnormality is seen. No gallstones,
gallbladder wall thickening, or biliary dilatation.

Pancreas: Unremarkable. No pancreatic ductal dilatation or
surrounding inflammatory changes.

Spleen: Normal in size without focal abnormality.

Adrenals/Urinary Tract: Adrenal glands are within normal limits.
Negative for left hydronephrosis or ureteral stone. Malrotated right
kidney with appearance of mild right hydronephrosis but no
hydroureter or obstructing stone. 4 mm stone upper pole of the right
kidney. The bladder is unremarkable

Stomach/Bowel: Stomach is within normal limits. Appendix appears
normal. No evidence of bowel wall thickening, distention, or
inflammatory changes.

Vascular/Lymphatic: Nonaneurysmal aorta. Mild aortic
atherosclerosis. No suspicious nodes.

Reproductive: Uterus and bilateral adnexa are unremarkable.

Other: Negative for free air or free fluid.

Musculoskeletal: No acute or suspicious osseous abnormality.
IMPRESSION: 1. Malrotated right kidney with mild hydronephrosis but no
hydroureter or obstructing stone. Intrarenal stone on the right.
2. No CT evidence for acute intra-abdominal or pelvic abnormality.

## 2021-08-27 ENCOUNTER — Ambulatory Visit (INDEPENDENT_AMBULATORY_CARE_PROVIDER_SITE_OTHER): Payer: 59 | Admitting: Urology

## 2021-08-27 ENCOUNTER — Encounter: Payer: Self-pay | Admitting: Urology

## 2021-08-27 ENCOUNTER — Other Ambulatory Visit: Payer: Self-pay

## 2021-08-27 VITALS — BP 154/77 | HR 81 | Ht 65.0 in | Wt 144.0 lb

## 2021-08-27 DIAGNOSIS — R3129 Other microscopic hematuria: Secondary | ICD-10-CM

## 2021-08-27 LAB — URINALYSIS, COMPLETE
Bilirubin, UA: NEGATIVE
Ketones, UA: NEGATIVE
Leukocytes,UA: NEGATIVE
Nitrite, UA: NEGATIVE
Protein,UA: NEGATIVE
RBC, UA: NEGATIVE
Specific Gravity, UA: 1.01 (ref 1.005–1.030)
Urobilinogen, Ur: 0.2 mg/dL (ref 0.2–1.0)
pH, UA: 7 (ref 5.0–7.5)

## 2021-08-27 LAB — MICROSCOPIC EXAMINATION
Bacteria, UA: NONE SEEN
RBC, Urine: NONE SEEN /hpf (ref 0–2)

## 2021-08-27 NOTE — Progress Notes (Signed)
   08/27/21  CC:  Chief Complaint  Patient presents with   Cysto    HPI: See my prior note 06/24/2021.  Saw Hilton Sinclair 07/28/2021 complaining of right flank discomfort with UA showing microhematuria and was scheduled for cystoscopy/CTU.  CTU showed no upper tract abnormalities.  Nonobstructing 4 mm right upper pole calculus  She has now developed bilateral flank pain and states that she has a burning pain and can feel the shape of her kidneys  Refer to rooming tab for vital signs NED. A&Ox3.   No respiratory distress   Abd soft, NT, ND Normal external genitalia with patent urethral meatus  Cystoscopy Procedure Note  Patient identification was confirmed, informed consent was obtained, and patient was prepped using Betadine solution.  Lidocaine jelly was administered per urethral meatus.    Procedure: - Flexible cystoscope introduced, without any difficulty.   - Thorough search of the bladder revealed:    normal urethral meatus    normal urothelium    no stones    no ulcers     no tumors    no urethral polyps    no trabeculation  - Ureteral orifices were normal in position and appearance.  Post-Procedure: - Patient tolerated the procedure well  Assessment/ Plan: No significant abnormalities CT urogram and cystoscopy Nonobstructing right renal calculus Discussed with Ms. Lady there are no findings on CT that suggest an GU etiology of her flank pain and recommended PCP follow-up to evaluate for a musculoskeletal or neurogenic source Incidental 5 mm right middle lobe pulmonary nodule.  Follow-up chest CT recommended 1 year if high risk.  Will message PCP    Riki Altes, MD

## 2021-09-08 ENCOUNTER — Other Ambulatory Visit: Payer: Self-pay

## 2021-09-08 ENCOUNTER — Encounter: Payer: Self-pay | Admitting: Primary Care

## 2021-09-08 ENCOUNTER — Ambulatory Visit (INDEPENDENT_AMBULATORY_CARE_PROVIDER_SITE_OTHER): Payer: 59 | Admitting: Primary Care

## 2021-09-08 ENCOUNTER — Other Ambulatory Visit (HOSPITAL_COMMUNITY)
Admission: RE | Admit: 2021-09-08 | Discharge: 2021-09-08 | Disposition: A | Discharge status: Home / Self Care | Payer: 59 | Source: Ambulatory Visit | Attending: Primary Care | Admitting: Primary Care

## 2021-09-08 VITALS — BP 126/78 | HR 78 | Temp 97.9°F | Ht 65.0 in | Wt 150.0 lb

## 2021-09-08 DIAGNOSIS — Z0001 Encounter for general adult medical examination with abnormal findings: Secondary | ICD-10-CM

## 2021-09-08 DIAGNOSIS — E119 Type 2 diabetes mellitus without complications: Secondary | ICD-10-CM | POA: Diagnosis not present

## 2021-09-08 DIAGNOSIS — R5382 Chronic fatigue, unspecified: Secondary | ICD-10-CM

## 2021-09-08 DIAGNOSIS — Z124 Encounter for screening for malignant neoplasm of cervix: Secondary | ICD-10-CM | POA: Insufficient documentation

## 2021-09-08 DIAGNOSIS — Z1231 Encounter for screening mammogram for malignant neoplasm of breast: Secondary | ICD-10-CM | POA: Diagnosis not present

## 2021-09-08 DIAGNOSIS — Z23 Encounter for immunization: Secondary | ICD-10-CM | POA: Diagnosis not present

## 2021-09-08 DIAGNOSIS — E7800 Pure hypercholesterolemia, unspecified: Secondary | ICD-10-CM | POA: Diagnosis not present

## 2021-09-08 DIAGNOSIS — R911 Solitary pulmonary nodule: Secondary | ICD-10-CM | POA: Insufficient documentation

## 2021-09-08 DIAGNOSIS — R0789 Other chest pain: Secondary | ICD-10-CM | POA: Diagnosis not present

## 2021-09-08 DIAGNOSIS — Z1211 Encounter for screening for malignant neoplasm of colon: Secondary | ICD-10-CM

## 2021-09-08 LAB — CBC
HCT: 38.7 % (ref 36.0–46.0)
Hemoglobin: 13.2 g/dL (ref 12.0–15.0)
MCHC: 34.1 g/dL (ref 30.0–36.0)
MCV: 88.5 fl (ref 78.0–100.0)
Platelets: 264 10*3/uL (ref 150.0–400.0)
RBC: 4.37 Mil/uL (ref 3.87–5.11)
RDW: 12.9 % (ref 11.5–15.5)
WBC: 4.5 10*3/uL (ref 4.0–10.5)

## 2021-09-08 LAB — COMPREHENSIVE METABOLIC PANEL
ALT: 27 U/L (ref 0–35)
AST: 22 U/L (ref 0–37)
Albumin: 4.3 g/dL (ref 3.5–5.2)
Alkaline Phosphatase: 60 U/L (ref 39–117)
BUN: 14 mg/dL (ref 6–23)
CO2: 29 mEq/L (ref 19–32)
Calcium: 9.6 mg/dL (ref 8.4–10.5)
Chloride: 100 mEq/L (ref 96–112)
Creatinine, Ser: 0.54 mg/dL (ref 0.40–1.20)
GFR: 99.01 mL/min (ref >60.00)
Glucose, Bld: 122 mg/dL — ABNORMAL HIGH (ref 70–99)
Potassium: 3.9 mEq/L (ref 3.5–5.1)
Sodium: 137 mEq/L (ref 135–145)
Total Bilirubin: 0.7 mg/dL (ref 0.2–1.2)
Total Protein: 6.7 g/dL (ref 6.0–8.3)

## 2021-09-08 LAB — HEMOGLOBIN A1C: Hgb A1c MFr Bld: 8.1 % — ABNORMAL HIGH (ref 4.6–6.5)

## 2021-09-08 LAB — LIPID PANEL
Cholesterol: 184 mg/dL (ref 0–200)
HDL: 56 mg/dL (ref >39.00)
LDL Cholesterol: 103 mg/dL — ABNORMAL HIGH (ref 0–99)
NonHDL: 127.84
Total CHOL/HDL Ratio: 3
Triglycerides: 124 mg/dL (ref 0.0–149.0)
VLDL: 24.8 mg/dL (ref 0.0–40.0)

## 2021-09-08 LAB — SEDIMENTATION RATE: Sed Rate: 12 mm/hr (ref 0–30)

## 2021-09-08 LAB — TSH: TSH: 3.61 u[IU]/mL (ref 0.35–5.50)

## 2021-09-08 LAB — VITAMIN D 25 HYDROXY (VIT D DEFICIENCY, FRACTURES): VITD: 27.15 ng/mL — ABNORMAL LOW (ref 30.00–100.00)

## 2021-09-08 LAB — VITAMIN B12: Vitamin B-12: 566 pg/mL (ref 211–911)

## 2021-09-08 LAB — C-REACTIVE PROTEIN: CRP: <1 mg/dL (ref 0.5–20.0)

## 2021-09-08 NOTE — Progress Notes (Signed)
Subjective:    Patient ID: Leah Blankenship, female    DOB: Jan 27, 1961, 60 y.o.   MRN: [id number]  HPI  Leah Blankenship is a very pleasant 60 y.o. female who presents today for complete physical and follow up of chronic conditions. She also has several concerns today.   She would also like to discuss a two month history of bilateral anterior  internal rib pain. Her pain is located to the bilateral anterior chest that is felt under the ribs. constant and throbbing, occurs with and without movement. She denies injury/trauma, bruising, rashes.   Also with chronic fatigue since January 2022, has been taking an OTC iron supplement and B12 vitamin daily for the last two weeks with some improvement. She does not take vitamin D.   Incidental finding of right middle lobe nodule measuring 5 mm from CT hematuria work up per Urology. Recommendations are to repeat in 12 months if high risk. She denies a history of smoking, unexplained weight loss, second hand smoke.   Immunizations: -Tetanus: Due -Influenza: Due -Covid-19: Has not completed -Shingles: One dose, due for next dose -Pneumonia: Pneumovax   Diet: Fair diet.  Exercise: No regular exercise.  Eye exam: Completes annually  Dental exam: Completes semi-annually   Pap Smear: Completed in 2016 Mammogram: Completed years ago  Colonoscopy: Overdue, referral placed last year, did not complete  BP Readings from Last 3 Encounters:  09/08/21 126/78  08/27/21 (!) 154/77  07/28/21 125/79        Review of Systems  Constitutional:  Positive for fatigue. Negative for unexpected weight change.  HENT:  Negative for rhinorrhea.   Respiratory:  Negative for shortness of breath.   Cardiovascular:  Negative for chest pain.  Gastrointestinal:  Negative for constipation and diarrhea.  Genitourinary:  Negative for difficulty urinating.  Musculoskeletal:  Positive for myalgias. Negative for arthralgias.  Skin:  Negative for rash.   Allergic/Immunologic: Negative for environmental allergies.  Neurological:  Negative for dizziness and headaches.  Hematological:  Negative for adenopathy.  Psychiatric/Behavioral:  The patient is not nervous/anxious.         Past Medical History:  Diagnosis Date   Gestational diabetes    Hyperlipidemia     Social History   Socioeconomic History   Marital status: Single    Spouse name: Not on file   Number of children: Not on file   Years of education: Not on file   Highest education level: Not on file  Occupational History   Not on file  Tobacco Use   Smoking status: Never   Smokeless tobacco: Never  Vaping Use   Vaping Use: Never used  Substance and Sexual Activity   Alcohol use: Yes    Alcohol/week: 1.0 standard drink    Types: 1 Standard drinks or equivalent per week   Drug use: No   Sexual activity: Not Currently    Comment: separated  Other Topics Concern   Not on file  Social History Narrative   Single.   Highest level of education 12th grade.   [employer], was previous truck Hospital doctor.   1 daughter   Enjoys spending time with her daughter, resting.   Social Determinants of Health   Financial Resource Strain: Not on file  Food Insecurity: Not on file  Transportation Needs: Not on file  Physical Activity: Not on file  Stress: Not on file  Social Connections: Not on file  Intimate Partner Violence: Not on file    Past Surgical History:  Procedure Laterality Date   FRACTURE SURGERY  1985   left leg    Family History  Problem Relation Age of Onset   Heart disease Father        MI and stent placement   Arthritis Father    Hyperlipidemia Father    Hypertension Father    Cancer Sister        cervical   Cancer Brother        colon   Colon polyps Brother    Hyperlipidemia Mother    Pancreatic cancer Maternal Grandmother     No Known Allergies  Current Outpatient Medications on File Prior to Visit  Medication Sig Dispense Refill   blood  glucose meter kit and supplies Dispense based on patient and insurance preference. Use up to four times daily as directed. (FOR ICD-10 E10.9, E11.9). 1 each 0   Continuous Blood Gluc Receiver (FREESTYLE LIBRE 14 DAY READER) DEVI Use with sensor to check blood sugar. 1 each 0   Continuous Blood Gluc Sensor (FREESTYLE LIBRE 14 DAY SENSOR) MISC Use as directed to check blood sugars. 6 each 1   Cranberry 500 MG TABS Take by mouth.     LANTUS SOLOSTAR 100 UNIT/ML Solostar Pen Inject 13 Units into the skin at bedtime.     metFORMIN (GLUCOPHAGE XR) 750 MG 24 hr tablet Take 1 tablet (750 mg total) by mouth 2 (two) times daily with a meal. For diabetes. 180 tablet 2   rosuvastatin (CRESTOR) 20 MG tablet TAKE 1 TABLET (20 MG TOTAL) BY MOUTH EVERY EVENING. FOR CHOLESTEROL 90 tablet 3   sitaGLIPtin (JANUVIA) 100 MG tablet Take 1 tablet (100 mg total) by mouth daily. For diabetes. 90 tablet 3   No current facility-administered medications on file prior to visit.    BP 126/78   Pulse 78   Temp 97.9 F (36.6 C) (Temporal)   Ht $R'5\' 5"'nk$  (1.651 m)   Wt 150 lb (68 kg)   SpO2 97%   BMI 24.96 kg/m  Objective:   Physical Exam HENT:     Right Ear: Tympanic membrane and ear canal normal.     Left Ear: Tympanic membrane and ear canal normal.     Nose: Nose normal.  Eyes:     Conjunctiva/sclera: Conjunctivae normal.     Pupils: Pupils are equal, round, and reactive to light.  Neck:     Thyroid: No thyromegaly.  Cardiovascular:     Rate and Rhythm: Normal rate and regular rhythm.     Heart sounds: No murmur heard. Pulmonary:     Effort: Pulmonary effort is normal.     Breath sounds: Normal breath sounds. No rales.  Chest:       Comments: Non tender to palpation. No rashes or bruising.   Abdominal:     General: Bowel sounds are normal.     Palpations: Abdomen is soft.     Tenderness: There is no abdominal tenderness.  Genitourinary:    Labia:        Right: No tenderness or lesion.        Left:  No tenderness or lesion.      Vagina: Normal.     Cervix: No cervical motion tenderness or discharge.     Uterus: Normal.      Adnexa: Right adnexa normal and left adnexa normal.     Comments: Vaginal atrophy. Musculoskeletal:        General: Normal range of motion.     Cervical back: Neck  supple.  Lymphadenopathy:     Cervical: No cervical adenopathy.  Skin:    General: Skin is warm and dry.     Findings: No rash.  Neurological:     Mental Status: She is alert and oriented to person, place, and time.     Cranial Nerves: No cranial nerve deficit.     Deep Tendon Reflexes: Reflexes are normal and symmetric.          Assessment & Plan:      This visit occurred during the SARS-CoV-2 public health emergency.  Safety protocols were in place, including screening questions prior to the visit, additional usage of staff PPE, and extensive cleaning of exam room while observing appropriate contact time as indicated for disinfecting solutions.

## 2021-09-08 NOTE — Assessment & Plan Note (Signed)
Due for influenza and second shingrix vaccine. Pap smear due, completed today. Colonoscopy overdue, referral placed again to GI. Mammogram overdue, orders placed.  Discussed the importance of a healthy diet and regular exercise in order for weight loss, and to reduce the risk of further co-morbidity.  Exam today as noted. Labs pending and also reviewed

## 2021-09-08 NOTE — Assessment & Plan Note (Signed)
To bilateral anterior lower ribs.   CT hematuria reviewed per Urology. No obvious cause for pain noted.  Will hold rosuvastatin for 2 weeks. Checking labs today.

## 2021-09-08 NOTE — Assessment & Plan Note (Addendum)
Evaluated by endocrinology in August 2022, diagnosed with Type 1 diabetes. Reviewed labs from care everywhere per endocrinology  Continue Lantus 13 units nightly, Januvia 100 mg, metformin XR 750 BID.

## 2021-09-08 NOTE — Patient Instructions (Addendum)
Stop by the lab prior to leaving today. I will notify you of your results once received.   [facility].   You will be contacted regarding your referral to GI for the colonoscopy.  Please let us know if you have not been contacted within two weeks.   It was a pleasure to see you today!  Preventive Care 59-62 Years Old, Female Preventive care refers to lifestyle choices and visits with your health care provider that can promote health and wellness. This includes: A yearly physical exam. This is also called an annual wellness visit. Regular dental and eye exams. Immunizations. Screening for certain conditions. Healthy lifestyle choices, such as: Eating a healthy diet. Getting regular exercise. Not using drugs or products that contain nicotine and tobacco. Limiting alcohol use. What can I expect for my preventive care visit? Physical exam Your health care provider will check your: Height and weight. These may be used to calculate your BMI (body mass index). BMI is a measurement that tells if you are at a healthy weight. Heart rate and blood pressure. Body temperature. Skin for abnormal spots. Counseling Your health care provider may ask you questions about your: Past medical problems. Family's medical history. Alcohol, tobacco, and drug use. Emotional well-being. Home life and relationship well-being. Sexual activity. Diet, exercise, and sleep habits. Work and work Statistician. Access to firearms. Method of birth control. Menstrual cycle. Pregnancy history. What immunizations do I need? Vaccines are usually given at various ages, according to a schedule. Your health care provider will recommend vaccines for you based on your age, medical history, and lifestyle or other factors, such as travel or where you work. What tests do I need? Blood tests Lipid and cholesterol levels. These may be checked every 5 years, or more often if you are over  68 years old. Hepatitis C test. Hepatitis B test. Screening Lung cancer screening. You may have this screening every year starting at age 52 if you have a 30-pack-year history of smoking and currently smoke or have quit within the past 15 years. Colorectal cancer screening. All adults should have this screening starting at age 7 and continuing until age 59. Your health care provider may recommend screening at age 6 if you are at increased risk. You will have tests every 1-10 years, depending on your results and the type of screening test. Diabetes screening. This is done by checking your blood sugar (glucose) after you have not eaten for a while (fasting). You may have this done every 1-3 years. Mammogram. This may be done every 1-2 years. Talk with your health care provider about when you should start having regular mammograms. This may depend on whether you have a family history of breast cancer. BRCA-related cancer screening. This may be done if you have a family history of breast, ovarian, tubal, or peritoneal cancers. Pelvic exam and Pap test. This may be done every 3 years starting at age 74. Starting at age 56, this may be done every 5 years if you have a Pap test in combination with an HPV test. Other tests STD (sexually transmitted disease) testing, if you are at risk. Bone density scan. This is done to screen for osteoporosis. You may have this scan if you are at high risk for osteoporosis. Talk with your health care provider about your test results, treatment options, and if necessary, the need for more tests. Follow these instructions at home: Eating and drinking  Eat a diet that  includes fresh fruits and vegetables, whole grains, lean protein, and low-fat dairy products. Take vitamin and mineral supplements as recommended by your health care provider. Do not drink alcohol if: Your health care provider tells you not to drink. You are pregnant, may be pregnant, or are  planning to become pregnant. If you drink alcohol: Limit how much you have to 0-1 drink a day. Be aware of how much alcohol is in your drink. In the [place] one drink equals one 12 oz bottle of beer (355 mL), one 5 oz glass of wine (148 mL), or one 1 oz glass of hard liquor (44 mL). Lifestyle Take daily care of your teeth and gums. Brush your teeth every morning and night with fluoride toothpaste. Floss one time each day. Stay active. Exercise for at least 30 minutes 5 or more days each week. Do not use any products that contain nicotine or tobacco, such as cigarettes, e-cigarettes, and chewing tobacco. If you need help quitting, ask your health care provider. Do not use drugs. If you are sexually active, practice safe sex. Use a condom or other form of protection to prevent STIs (sexually transmitted infections). If you do not wish to become pregnant, use a form of birth control. If you plan to become pregnant, see your health care provider for a prepregnancy visit. If told by your health care provider, take low-dose aspirin daily starting at age 86. Find healthy ways to cope with stress, such as: Meditation, yoga, or listening to music. Journaling. Talking to a trusted person. Spending time with friends and family. Safety Always wear your seat belt while driving or riding in a vehicle. Do not drive: If you have been drinking alcohol. Do not ride with someone who has been drinking. When you are tired or distracted. While texting. Wear a helmet and other protective equipment during sports activities. If you have firearms in your house, make sure you follow all gun safety procedures. What's next? Visit your health care provider once a year for an annual wellness visit. Ask your health care provider how often you should have your eyes and teeth checked. Stay up to date on all vaccines. This information is not intended to replace advice given to you by your health care provider. Make sure  you discuss any questions you have with your health care provider. Document Revised: 02/12/2021 Document Reviewed: 08/16/2018 Elsevier Patient Education  [employer].    Influenza (Flu) Vaccine (Inactivated or Recombinant): What You Need to Know 1. Why get vaccinated? Influenza vaccine can prevent influenza (flu). Flu is a contagious disease that spreads around the [place] every year, usually between October and May. Anyone can get the flu, but it is more dangerous for some people. Infants and young children, people 69 years and older, pregnant people, and people with certain health conditions or a weakened immune system are at greatest risk of flu complications. Pneumonia, bronchitis, sinus infections, and ear infections are examples of flu-related complications. If you have a medical condition, such as heart disease, cancer, or diabetes, flu can make it worse. Flu can cause fever and chills, sore throat, muscle aches, fatigue, cough, headache, and runny or stuffy nose. Some people may have vomiting and diarrhea, though this is more common in children than adults. In an average year, thousands of people in the [place] die from flu, and many more are hospitalized. Flu vaccine prevents millions of illnesses and flu-related visits to the doctor each year. 2. Influenza vaccines CDC recommends  everyone 6 months and older get vaccinated every flu season. Children 6 months through 51 years of age may need 2 doses during a single flu season. Everyone else needs only 1 dose each flu season. It takes about 2 weeks for protection to develop after vaccination. There are many flu viruses, and they are always changing. Each year a new flu vaccine is made to protect against the influenza viruses believed to be likely to cause disease in the upcoming flu season. Even when the vaccine doesn't exactly match these viruses, it may still provide some protection. Influenza vaccine does not cause  flu. Influenza vaccine may be given at the same time as other vaccines. 3. Talk with your health care provider Tell your vaccination provider if the person getting the vaccine: Has had an allergic reaction after a previous dose of influenza vaccine, or has any severe, life-threatening allergies Has ever had Guillain-Barr Syndrome (also called "GBS") In some cases, your health care provider may decide to postpone influenza vaccination until a future visit. Influenza vaccine can be administered at any time during pregnancy. People who are or will be pregnant during influenza season should receive inactivated influenza vaccine. People with minor illnesses, such as a cold, may be vaccinated. People who are moderately or severely ill should usually wait until they recover before getting influenza vaccine. Your health care provider can give you more information. 4. Risks of a vaccine reaction Soreness, redness, and swelling where the shot is given, fever, muscle aches, and headache can happen after influenza vaccination. There may be a very small increased risk of Guillain-Barr Syndrome (GBS) after inactivated influenza vaccine (the flu shot). Young children who get the flu shot along with pneumococcal vaccine (PCV13) and/or DTaP vaccine at the same time might be slightly more likely to have a seizure caused by fever. Tell your health care provider if a child who is getting flu vaccine has ever had a seizure. People sometimes faint after medical procedures, including vaccination. Tell your provider if you feel dizzy or have vision changes or ringing in the ears. As with any medicine, there is a very remote chance of a vaccine causing a severe allergic reaction, other serious injury, or death. 5. What if there is a serious problem? An allergic reaction could occur after the vaccinated person leaves the clinic. If you see signs of a severe allergic reaction (hives, swelling of the face and throat,  difficulty breathing, a fast heartbeat, dizziness, or weakness), call 9-1-1 and get the person to the nearest hospital. For other signs that concern you, call your health care provider. Adverse reactions should be reported to the Vaccine Adverse Event Reporting System (VAERS). Your health care provider will usually file this report, or you can do it yourself. Visit the VAERS website at www.vaers.SamedayNews.es or call 437-195-0397. VAERS is only for reporting reactions, and VAERS staff members do not give medical advice. 6. The National Vaccine Injury Compensation Program The [employer] Vaccine Injury Compensation Program (VICP) is a federal program that was created to compensate people who may have been injured by certain vaccines. Claims regarding alleged injury or death due to vaccination have a time limit for filing, which may be as short as two years. Visit the VICP website at GoldCloset.com.ee or call 774-486-2579 to learn about the program and about filing a claim. 7. How can I learn more? Ask your health care provider. Call your local or state health department. Visit the website of the Food and Drug Administration (FDA) for  vaccine package inserts and additional information at TraderRating.uy. Contact the Centers for Disease Control and Prevention (CDC): Call 830 084 6753 (1-800-CDC-INFO) or Visit CDC's website at https://gibson.com/. Vaccine Information Statement Inactivated Influenza Vaccine (07/24/2020) This information is not intended to replace advice given to you by your health care provider. Make sure you discuss any questions you have with your health care provider. Document Revised: 09/10/2020 Document Reviewed: 09/10/2020 Elsevier Patient Education  2022 [employer].

## 2021-09-08 NOTE — Assessment & Plan Note (Signed)
Chronic since January 2022.  Checking labs today. Patient denies depression and anxiety symptoms.  Await results.

## 2021-09-08 NOTE — Assessment & Plan Note (Signed)
Incidental finding on CT hematuria work up Patient elected for repeat CT chest in 2023.

## 2021-09-08 NOTE — Assessment & Plan Note (Signed)
Compliant to rosuvastatin 20 mg daily, repeat lipid panel pending.  Will have her hold rosuvastatin for 2 weeks given chest wall/rib pain.

## 2021-09-09 LAB — CYCLIC CITRUL PEPTIDE ANTIBODY, IGG: Cyclic Citrullin Peptide Ab: <16 UNITS

## 2021-09-09 LAB — CYTOLOGY - PAP
Comment: NEGATIVE
Diagnosis: NEGATIVE
High risk HPV: NEGATIVE

## 2021-09-09 LAB — RHEUMATOID FACTOR: Rheumatoid fact SerPl-aCnc: <14 IU/mL (ref <14)

## 2021-09-09 NOTE — Addendum Note (Signed)
Addended by: Donnamarie Poag on: 09/09/2021 08:02 AM   Modules accepted: Orders

## 2021-09-21 DIAGNOSIS — R0789 Other chest pain: Secondary | ICD-10-CM

## 2021-09-24 ENCOUNTER — Ambulatory Visit
Admission: RE | Admit: 2021-09-24 | Discharge: 2021-09-24 | Disposition: A | Discharge status: Home / Self Care | Payer: 59 | Source: Ambulatory Visit | Attending: Primary Care | Admitting: Primary Care

## 2021-09-24 ENCOUNTER — Other Ambulatory Visit: Payer: Self-pay

## 2021-09-24 DIAGNOSIS — R0789 Other chest pain: Secondary | ICD-10-CM

## 2021-10-19 ENCOUNTER — Telehealth: Payer: Self-pay

## 2021-10-19 NOTE — Telephone Encounter (Signed)
Pt. Calling to schedule colonoscopy she in in que from september

## 2021-10-20 NOTE — Telephone Encounter (Signed)
Returned patients call. LVM to call back °

## 2021-10-22 ENCOUNTER — Ambulatory Visit
Admission: RE | Admit: 2021-10-22 | Discharge: 2021-10-22 | Disposition: A | Discharge status: Home / Self Care | Payer: 59 | Source: Ambulatory Visit | Attending: Primary Care | Admitting: Primary Care

## 2021-10-22 ENCOUNTER — Other Ambulatory Visit: Payer: Self-pay

## 2021-10-22 DIAGNOSIS — Z1231 Encounter for screening mammogram for malignant neoplasm of breast: Secondary | ICD-10-CM

## 2021-11-09 DIAGNOSIS — R0789 Other chest pain: Secondary | ICD-10-CM

## 2021-11-09 MED ORDER — GABAPENTIN 100 MG PO CAPS: ORAL_CAPSULE | ORAL | 0 refills | Status: AC

## 2021-12-05 IMAGING — CR DG CHEST 2V
1 series · 2 of 2 positions shown · non-contrast
Comparison: None.

CLINICAL DATA: Bilateral chest wall pain for 6 months

EXAM:
CHEST - 2 VIEW

[Series 1: dg chest 2 view · 0.14mm/px · 2 of 2 slices shown]
[im 1/2]
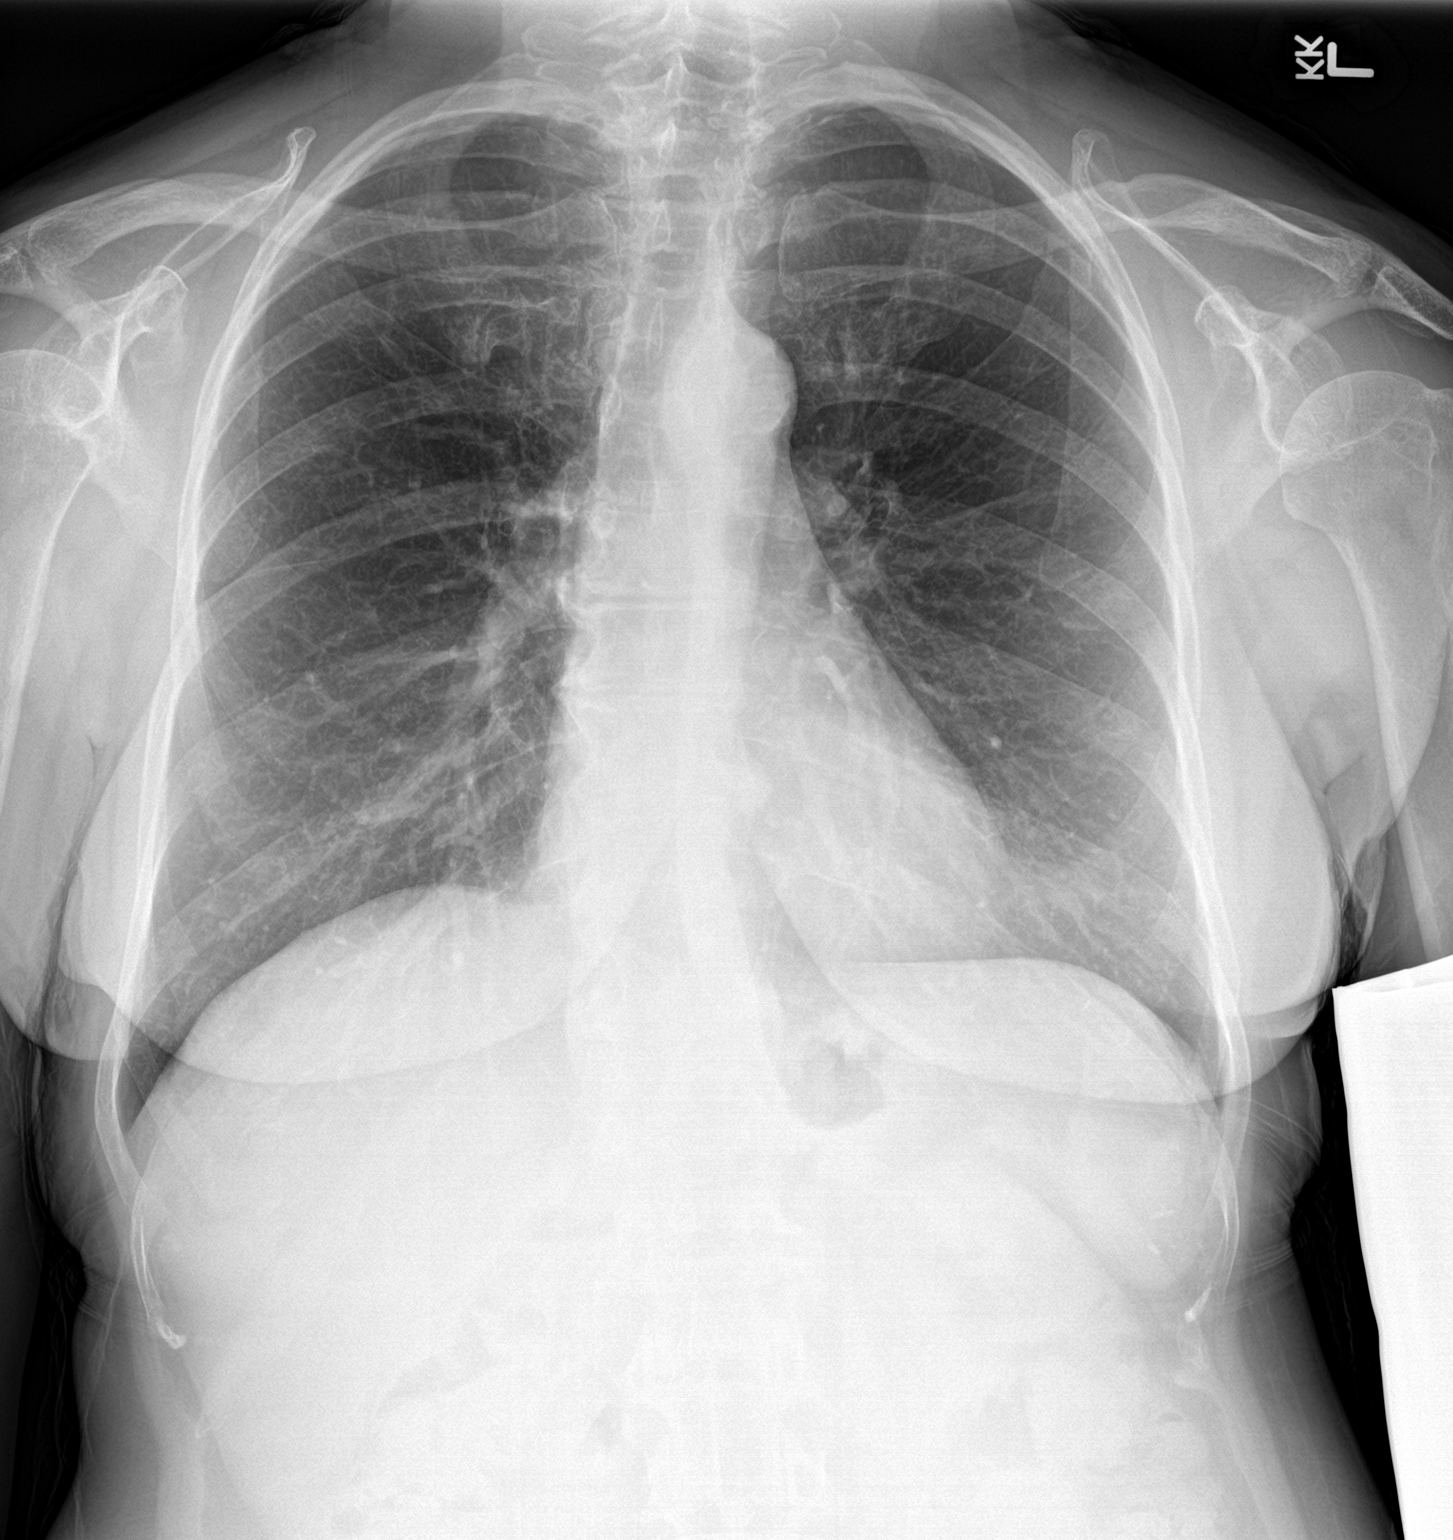
[im 2/2]
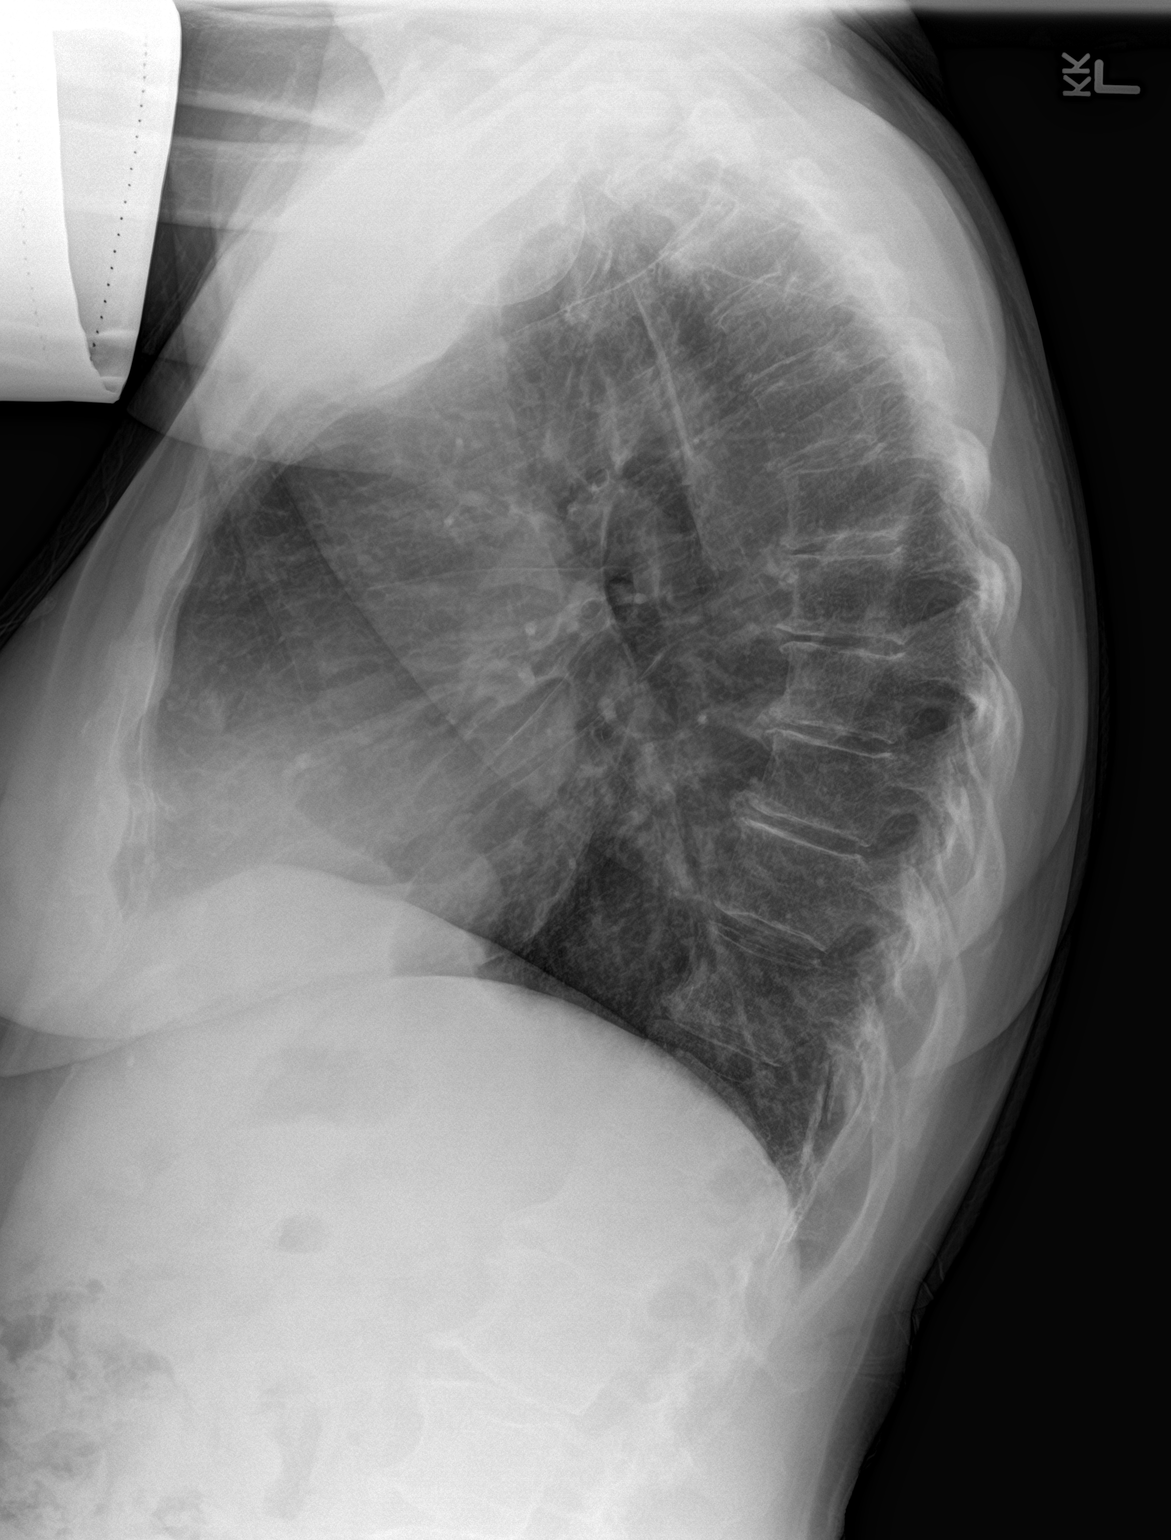

[2 of 2 positions shown; findings below may reference images not displayed]

FINDINGS: Normal heart size. Normal mediastinal contour. No pneumothorax. No
pleural effusion. Lungs appear clear, with no acute consolidative
airspace disease and no pulmonary edema. No displaced fractures.
IMPRESSION: No active cardiopulmonary disease.

## 2021-12-19 ENCOUNTER — Other Ambulatory Visit: Payer: Self-pay | Admitting: Primary Care

## 2021-12-19 DIAGNOSIS — E119 Type 2 diabetes mellitus without complications: Secondary | ICD-10-CM

## 2021-12-19 NOTE — Telephone Encounter (Signed)
Patient seeing endocrinology, send refill request to Dr. Gershon Crane through [facility].

## 2021-12-22 NOTE — Telephone Encounter (Signed)
Left message to return call to our office.  

## 2021-12-24 NOTE — Telephone Encounter (Signed)
Mail box is full not able to leave message.   ?

## 2021-12-27 NOTE — Telephone Encounter (Signed)
Have called patient not able to reach. My chart message sent to let know needs to get from endo.

## 2022-01-22 ENCOUNTER — Other Ambulatory Visit: Payer: Self-pay | Admitting: Primary Care

## 2022-01-22 DIAGNOSIS — E119 Type 2 diabetes mellitus without complications: Secondary | ICD-10-CM

## 2022-01-24 MED ORDER — SITAGLIPTIN PHOSPHATE 100 MG PO TABS: 100.0000 mg | ORAL_TABLET | Freq: Every day | ORAL | 0 refills | Status: AC

## 2022-01-24 NOTE — Addendum Note (Signed)
Addended by: Doreene Nest on: 01/24/2022 04:58 PM   Modules accepted: Orders

## 2022-02-15 ENCOUNTER — Other Ambulatory Visit: Payer: Self-pay | Admitting: Primary Care

## 2022-02-15 DIAGNOSIS — E7800 Pure hypercholesterolemia, unspecified: Secondary | ICD-10-CM

## 2022-06-29 ENCOUNTER — Telehealth: Payer: Self-pay

## 2022-07-01 NOTE — Telephone Encounter (Signed)
error
